# Patient Record
Sex: Male | Born: 1950 | Race: Black or African American | Hispanic: No | Marital: Married | Smoking: Former smoker
Health system: Southern US, Community
[De-identification: ages and names within clinical notes are randomized; demographics above are authoritative.]

## PROBLEM LIST (undated history)

## (undated) DIAGNOSIS — E785 Hyperlipidemia, unspecified: Secondary | ICD-10-CM

## (undated) DIAGNOSIS — I10 Essential (primary) hypertension: Secondary | ICD-10-CM

## (undated) DIAGNOSIS — N184 Chronic kidney disease, stage 4 (severe): Secondary | ICD-10-CM

## (undated) DIAGNOSIS — I2510 Atherosclerotic heart disease of native coronary artery without angina pectoris: Secondary | ICD-10-CM

## (undated) DIAGNOSIS — G4733 Obstructive sleep apnea (adult) (pediatric): Secondary | ICD-10-CM

## (undated) HISTORY — DX: Hyperlipidemia, unspecified: E78.5

## (undated) HISTORY — DX: Chronic kidney disease, stage 4 (severe): N18.4

## (undated) HISTORY — DX: Atherosclerotic heart disease of native coronary artery without angina pectoris: I25.10

## (undated) HISTORY — PX: NASAL SINUS SURGERY: SHX719

## (undated) HISTORY — DX: Morbid (severe) obesity due to excess calories: E66.01

## (undated) HISTORY — DX: Obstructive sleep apnea (adult) (pediatric): G47.33

## (undated) HISTORY — DX: Essential (primary) hypertension: I10

## (undated) HISTORY — PX: CHOLECYSTECTOMY: SHX55

---

## 2003-06-10 ENCOUNTER — Ambulatory Visit (HOSPITAL_COMMUNITY): Admission: RE | Admit: 2003-06-10 | Discharge: 2003-06-10 | Payer: Self-pay | Admitting: Cardiology

## 2003-08-19 ENCOUNTER — Encounter: Admission: RE | Admit: 2003-08-19 | Discharge: 2003-11-17 | Payer: Self-pay | Admitting: Cardiology

## 2004-06-27 ENCOUNTER — Ambulatory Visit: Payer: Self-pay | Admitting: Cardiology

## 2004-12-21 ENCOUNTER — Ambulatory Visit: Payer: Self-pay | Admitting: Cardiology

## 2004-12-28 ENCOUNTER — Ambulatory Visit: Payer: Self-pay | Admitting: Cardiology

## 2004-12-28 ENCOUNTER — Inpatient Hospital Stay (HOSPITAL_BASED_OUTPATIENT_CLINIC_OR_DEPARTMENT_OTHER): Admission: RE | Admit: 2004-12-28 | Discharge: 2004-12-28 | Payer: Self-pay | Admitting: Cardiology

## 2005-02-21 ENCOUNTER — Ambulatory Visit: Payer: Self-pay | Admitting: Cardiology

## 2006-03-07 ENCOUNTER — Ambulatory Visit: Payer: Self-pay | Admitting: Cardiology

## 2010-01-04 ENCOUNTER — Ambulatory Visit: Payer: Self-pay | Admitting: Internal Medicine

## 2010-01-09 ENCOUNTER — Ambulatory Visit (HOSPITAL_COMMUNITY): Admission: RE | Admit: 2010-01-09 | Discharge: 2010-01-10 | Payer: Self-pay | Admitting: General Surgery

## 2010-01-09 ENCOUNTER — Encounter (INDEPENDENT_AMBULATORY_CARE_PROVIDER_SITE_OTHER): Payer: Self-pay | Admitting: General Surgery

## 2010-05-08 LAB — HEPATIC FUNCTION PANEL
ALT: 100 U/L — ABNORMAL HIGH (ref 0–53)
ALT: 40 U/L (ref 0–53)
ALT: 49 U/L (ref 0–53)
AST: 56 U/L — ABNORMAL HIGH (ref 0–37)
Albumin: 4.4 g/dL (ref 3.5–5.2)
Alkaline Phosphatase: 59 U/L (ref 39–117)
Alkaline Phosphatase: 78 U/L (ref 39–117)
Indirect Bilirubin: 0.5 mg/dL (ref 0.3–0.9)
Indirect Bilirubin: 0.6 mg/dL (ref 0.3–0.9)
Indirect Bilirubin: 0.8 mg/dL (ref 0.3–0.9)
Total Bilirubin: 0.8 mg/dL (ref 0.3–1.2)
Total Protein: 7.9 g/dL (ref 6.0–8.3)
Total Protein: 8.5 g/dL — ABNORMAL HIGH (ref 6.0–8.3)

## 2010-05-08 LAB — DIFFERENTIAL
Basophils Absolute: 0.1 10*3/uL (ref 0.0–0.1)
Eosinophils Absolute: 0.2 10*3/uL (ref 0.0–0.7)
Eosinophils Relative: 2 % (ref 0–5)
Eosinophils Relative: 4 % (ref 0–5)
Lymphocytes Relative: 22 % (ref 12–46)
Lymphocytes Relative: 22 % (ref 12–46)
Lymphs Abs: 1.8 10*3/uL (ref 0.7–4.0)
Monocytes Relative: 10 % (ref 3–12)
Neutro Abs: 4.2 10*3/uL (ref 1.7–7.7)

## 2010-05-08 LAB — BASIC METABOLIC PANEL
BUN: 12 mg/dL (ref 6–23)
BUN: 15 mg/dL (ref 6–23)
CO2: 26 mEq/L (ref 19–32)
Chloride: 102 mEq/L (ref 96–112)
Chloride: 105 mEq/L (ref 96–112)
Creatinine, Ser: 1.51 mg/dL — ABNORMAL HIGH (ref 0.4–1.5)
GFR calc Af Amer: 36 mL/min — ABNORMAL LOW (ref >60)
GFR calc non Af Amer: 48 mL/min — ABNORMAL LOW (ref >60)
Potassium: 4.5 mEq/L (ref 3.5–5.1)
Potassium: 4.7 mEq/L (ref 3.5–5.1)
Sodium: 137 mEq/L (ref 135–145)
Sodium: 139 mEq/L (ref 135–145)

## 2010-05-08 LAB — CBC
HCT: 36.8 % — ABNORMAL LOW (ref 39.0–52.0)
Hemoglobin: 12 g/dL — ABNORMAL LOW (ref 13.0–17.0)
MCH: 26.5 pg (ref 26.0–34.0)
MCV: 81.5 fL (ref 78.0–100.0)
Platelets: 300 10*3/uL (ref 150–400)
RBC: 4.52 MIL/uL (ref 4.22–5.81)
RDW: 16.5 % — ABNORMAL HIGH (ref 11.5–15.5)
WBC: 6.3 10*3/uL (ref 4.0–10.5)
WBC: 7.9 10*3/uL (ref 4.0–10.5)

## 2010-05-08 LAB — SURGICAL PCR SCREEN: MRSA, PCR: NEGATIVE

## 2010-05-08 LAB — GLUCOSE, CAPILLARY: Glucose-Capillary: 158 mg/dL — ABNORMAL HIGH (ref 70–99)

## 2010-05-08 LAB — AMYLASE: Amylase: 62 U/L (ref 0–105)

## 2010-07-13 NOTE — Assessment & Plan Note (Signed)
[facility] OFFICE NOTE   NAME:[name], [name] ORZECH                       MRN:          [id number]  DATE:03/07/2006                            DOB:          Apr 07, 1950    REASON FOR VISIT:  Routine cardiac followup.   HISTORY OF PRESENT ILLNESS:  [name] [name] returns for a yearly followup  visit.  He continues to report no problems with significant angina.  He  has had some problems with blood pressure control recently, and is now  on two new medications, labetalol and amlodipine, and good blood  pressure control today.  He is reporting some trouble with leg pain with  ambulation suggesting claudication, and states that this has been  getting slowly worse over the last few years.  His electrocardiogram  today shows sinus rhythm at 69 beats per minute.  He continues to use  CPAP at home for sleep apnea.  He states that Dr. Rowe Pavy has been  following his lipids.  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   ALLERGIES:  No known drug allergies.   PRESENT MEDICATIONS:  1. Aspirin 81 mg p.o. daily.  2. Diovan 160 mg p.o. daily.  3. Amlodipine 10 mg p.o. daily.  4. Lasix 40 p.o. daily.  5. Labetalol 200 mg p.o. b.i.d.  6. Doxazosin 10 mg p.o. daily.  7. Actos 30 mg p.o. daily.  8. Gemfibrozil 600 mg p.o. b.i.d.  9. Metformin 1000 mg p.o. b.i.d.  10.Glimepiride 4 mg p.o. daily.   REVIEW OF SYSTEMS:  As described in history of present illness.  He has  had no palpitations or syncope.  He does not report any major lower  extremity edema.   PHYSICAL EXAMINATION:  Blood pressure is 115/70, heart rate is 69,  weight is 351 pounds.  This is a morbidly obese male in no acute distress.  HEENT:  Conjunctivae and lids are normal.  Oropharynx is clear.  NECK:  Supple without elevated jugular venous pressure or loud bruits.  LUNGS:  Clear without labored breathing.  CARDIAC:  Exam reveals a regular rate and  rhythm without rub, murmur or  gallop.  ABDOMEN:  Obese, unable to adequately palpate organs.  Bowel sounds  present.  No tenderness to palpation.  EXTREMITIES:  Revealed 1+ edema below the knees bilaterally, symmetric.  SKIN:  Warm and dry, no ulcerative changes are noted.  MUSCULOSKELETAL:  No kyphosis is noted.   IMPRESSION AND RECOMMENDATIONS:  1. Nonobstructive coronary artery disease with previously documented      50% proximal left anterior descending stenosis, presently      asymptomatic from the perspective of angina.  I have reinforced      with him risk factor modification including weight loss and optimal      control of his diabetes, hypertension and hyperlipidemia under the      direction of Dr. Rowe Pavy.  His goal LDL cholesterol should be in the      70 to 80 range.  Blood pressure looks good today, and his  electrocardiogram is normal.  I will continue basic symptom      surveillance on a yearly basis.  2. Leg discomfort.  As outlined suggestive of potential claudication.      We will arrange lower extremity arterial studies and proceed from      there.     Satira Sark, MD  Electronically Signed    SGM/MedQ  DD: 03/07/2006  DT: 03/08/2006  Job #: [id number]   cc:   Sherril Cong, MD

## 2010-07-13 NOTE — Cardiovascular Report (Signed)
[name] [name], [name] [name] NO.:  [id number]   MEDICAL RECORD NO.:  [id number]          PATIENT TYPE:  OIB   LOCATION:  1962                         FACILITY:  [facility]   PHYSICIAN:  [name] [name], M.D. [facility] DATE OF BIRTH:  [birth year]   DATE OF PROCEDURE:  12/28/2004  DATE OF DISCHARGE:                              CARDIAC CATHETERIZATION   CLINICAL HISTORY:  [name] [name] is 60 years old and is a resident of  [place], [place].  He was cathed 18 months ago by Dr. Albertine [name] and found  to have nonobstructive disease.  He was enrolled in the [facility] trial  and underwent intravascular ultrasound of the LAD and was started on  STRADIVARIUS study drug.  He now returns for follow-up catheterization.  He  has had no cardiac symptoms.   PROCEDURE:  The procedure was performed via the right femoral arteries and  arterial sheath.  We used 5 [place] catheters for the right coronary artery  and left ventriculogram and we used a 6 [place] q.4h. guiding catheter with  side holes for the LAD.  At completion of the diagnostic angiogram, we  performed intravascular ultrasound.  The patient was given weight adjusted  heparin upon the ACT greater than 200 seconds.  We passed a Luge wire down  to the distal LAD.  After giving intracoronary nitroglycerin, we advanced an  IVUS catheter down to just past the mid LAD and did automatic pullback.  Repeat diagnostic study was then performed through the guiding catheter.  The patient tolerated the procedure well and left the laboratory in  satisfactory condition.   RESULTS:  Left main coronary artery:  Left main coronary artery was free of  significant disease.   Left anterior descending coronary artery:  Left anterior descending coronary  artery gave rise to a large diagonal branch and large septal perforator and  then two small septal perforators under the very small diagonal branch.  There was 50% narrowing in the proximal LAD after the first  septal  perforator and there were irregularities in the proximal and mid LAD.   Circumflex artery:  Circumflex artery gave to a large ramus branch and two  posterolateral branches.  These vessels were irregular but free of major  obstruction.   Right coronary artery:  The right coronary artery was very ectatic in its  proximal and mid portion.  The vessel gave rise to a conus branch, right  ventricular branch, dual posterior descending branches and two  posterolateral branches.  These vessels were irregular and ectatic but there  was no significant obstruction.   Left ventriculogram:  The left ventriculogram was performed in the RAO  projection shows good wall motion with no areas of hypokinesis.  The  estimated ejection fraction was 60%.  The IVUS measurements in the LAD  showed the vessel was very clean in the mid portion.  There was a large  plaque burden in the proximal portion and at its most severe area, there was  an 80% plaque burden meaning that the area of plaque was 80% the area of the  vessel.  The  lumen was about 2.4.  It was a very large vessel with a  diameter of about 5.3 and the lumen of about 2.4 at its highest portion.  The area of stenosis measured as the area of tightest stenosis divided by  the distal reference lumen area was 55%.  Aortic pressure was 158/93 with  mean of 121 and left ventricular pressure was 158/22.   CONCLUSION:  1.  Nonobstructive coronary artery disease with 50% narrowing in the      proximal left anterior descending,  Irregularities in the circumflex artery, marked ectasia of the right  coronary artery and normal left ventricular function with an estimated  ejection fraction of 60%.  1.  Intravascular ultrasound as part of the STRADIVARIUS trial with      moderately large plaque burden in the proximal left anterior descending.   RECOMMENDATIONS:  Will plan continued medical management and secondary to  risk factor modification.            ______________________________  [name] [name], M.D. [facility]     BB/MEDQ  D:  12/28/2004  T:  12/28/2004  Job:  [id number]   cc:   [name] [name], M.D. [facility]  [address]  [place]  [place] [address]   Cardiopulmonary Lab

## 2010-07-13 NOTE — Cardiovascular Report (Signed)
[name] [name], [name] [name]                          ACCOUNT NO.:  [id number]   MEDICAL RECORD NO.:  [id number]                   PATIENT TYPE:  OIB   LOCATION:  2858                                 FACILITY:  [facility]   PHYSICIAN:  Ethelle Lyon, M.D. [facility]         DATE OF BIRTH:  [birth year]   DATE OF PROCEDURE:  06/10/2003  DATE OF DISCHARGE:                              CARDIAC CATHETERIZATION   PROCEDURE:  Left-heart catheterization, left ventriculography, coronary  angiography, intravascular ultrasound of the LAD for the Stradivarius trial.   INDICATION:  Mr. Richburg is a 60 year old gentleman with risk factors of  morbid obesity, sleep apnea, diabetes mellitus, hypertension and  dyslipidemia who presents with exertional dyspnea and chest discomfort.  Exercise Cardiolite performed on May 27, 2003, suggested ischemia of the  inferior and inferolateral walls.  He was therefore referred for diagnostic  angiography, right heart catheterization.  The patient also consented to  participate in the Stradivarius trial.   PROCEDURAL TECHNIQUE:  Informed consent was obtained.  Under 1% lidocaine  local anesthesia, a 7-French sheath was placed in the right common femoral  vein, and a 6-French sheath in the right common femoral artery using the  modified Seldinger technique.  Diagnostic angiography and ventriculography  were performed using JL-4, JR-4 and pigtail catheters.  We then proceeded to  intravascular ultrasound.  Anticoagulation was initiated with bivalirudin to  achieve and maintain an ACT of greater than 200 seconds.  A 6-French CLS-4  guide was advanced over wire and engaged in the ostium of the left main.  A  luge wire was advanced to the distal LAD without difficulty.  Intracoronary  nitroglycerin, 200 micrograms, was administered.  Intravascular ultrasound  was then performed using automated pullback.  Intravenous ultrasound  catheter and wire were then removed.  Repeat angiography  demonstrated no  damage to the vessel.  The patient tolerated the procedure well and was  transferred to the holding room in stable condition.   COMPLICATIONS:  None.   FINDINGS:  1. Hemodynamics:  RA 25/19/17, RV 47/15/22.  PA 50/25/35.  PCW 30.  LV     143/14/30, cardiac output/index 8.1/3.2.  2. No aortic stenosis or mitral stenosis.   VENTRICULOGRAPHY:  Ejection fraction 65% without regional wall-motion  abnormality.  There is no mitral regurgitation.   CORONARY ANGIOGRAPHY:  1. Left main:  Angiographically normal.  2. LAD:  A large vessel which is ectatic proximally.  It gives rise to a     single large diagonal branch.  The mid LAD has 30 then 20% stenosis.  3. Ramus intermedius:  A large vessel which is angiographically normal.  4. Circumflex:  A small vessel giving rise to a single obtuse marginal.     There is a 20% stenosis of the proximal vessel.  It is ectatic     proximally.  5. RCA:  A very-large, ectatic vessel.  No stenosis.   IMPRESSION/RECOMMENDATION:  The patient with ectatic coronaries and mild  coronary artery disease.  He also has moderate pulmonary hypertension in the  setting of elevated left ventricular end-diastolic pressure.  We will  continue with aggressive risk-factor modification.  In addition, the patient  will be participating in the Stradivarius trial randomizing rimonabant  versus placebo.                                               Ethelle Lyon, M.D. [facility]    WED/MEDQ  D:  06/10/2003  T:  06/12/2003  Job:  [id number]   cc:   Sherril Cong, Dr.   Satira Sark, M.D. [facility]

## 2010-08-17 ENCOUNTER — Ambulatory Visit (INDEPENDENT_AMBULATORY_CARE_PROVIDER_SITE_OTHER): Payer: 59 | Admitting: Adult Health

## 2010-08-17 ENCOUNTER — Encounter: Payer: Self-pay | Admitting: Adult Health

## 2010-08-17 DIAGNOSIS — I2510 Atherosclerotic heart disease of native coronary artery without angina pectoris: Secondary | ICD-10-CM

## 2010-08-17 DIAGNOSIS — R0602 Shortness of breath: Secondary | ICD-10-CM

## 2010-08-17 DIAGNOSIS — G4733 Obstructive sleep apnea (adult) (pediatric): Secondary | ICD-10-CM | POA: Insufficient documentation

## 2010-08-17 DIAGNOSIS — I10 Essential (primary) hypertension: Secondary | ICD-10-CM

## 2010-08-17 DIAGNOSIS — E782 Mixed hyperlipidemia: Secondary | ICD-10-CM | POA: Insufficient documentation

## 2010-08-17 NOTE — Assessment & Plan Note (Signed)
The patient has been seen and examined by myself and Dr. Lattie Haw.  The plan is to have a 2 day stress myoview completed to evaluate for progression of CAD in patient with multiple CVRF's,  Will also evaluate him with Echo for LV fx with known HTN and OSA for PA pressures.  Will follow-up with him to discuss the results.

## 2010-08-17 NOTE — Assessment & Plan Note (Signed)
ABG's were not completed with the PFT's and therefore will have this done for evaluation.

## 2010-08-17 NOTE — Progress Notes (Signed)
HPI:  Mr. Broner is a morbidly obese patient who has been lost to follow-up from a cardiac standpoint, last seen by Dr. Domenic Polite in January of 2008, with known history of nonobstructive CAD per cath in 2005 and 2006 with 50% LAD stenosis, OSA, diabetes, Hypertension, hyperlipidemia.  He is followed by Dr. Iona Beard and was seen recently for complaints of worsening DOE. He had PFT's completed that were found to be normal.  He has had recent labs to evaluate kidney status, cholesterol status which were also found to be WNL.  As a result of known history of CAD, we are asked to reevaluate him to assess if increased shortness of breath may be related to progressive CAD.  He denies chest pain, but states that he cannot work more than 176ft without having to stop and rest because of shortness of breath. He states tha the is not dizzy or diaphoretic, but has noticed increased fatigue. He is medically compliant.  No Known Allergies  Current Outpatient Prescriptions  Medication Sig Dispense Refill  . amLODipine (NORVASC) 10 MG tablet Take 10 mg by mouth daily.        Marland Kitchen aspirin 81 MG tablet Take 81 mg by mouth daily.        Marland Kitchen doxazosin (CARDURA) 2 MG tablet Take 2 mg by mouth 3 (three) times daily.        . furosemide (LASIX) 40 MG tablet Take 40 mg by mouth daily.        Marland Kitchen gemfibrozil (LOPID) 600 MG tablet Take 600 mg by mouth 2 (two) times daily.        Marland Kitchen glimepiride (AMARYL) 4 MG tablet Take 4 mg by mouth daily before breakfast.        . labetalol (NORMODYNE) 200 MG tablet Take 200 mg by mouth daily.        . metFORMIN (GLUCOPHAGE) 1000 MG tablet Take 1,000 mg by mouth 2 (two) times daily with a meal.        . Multiple Vitamin (ONE-A-DAY MENS PO) Take 1 tablet by mouth daily.        . saxagliptin HCl (ONGLYZA) 5 MG TABS tablet Take 5 mg by mouth daily.        . valsartan (DIOVAN) 160 MG tablet Take 160 mg by mouth daily.          Past Medical History  Diagnosis Date  . Coronary artery disease    Nonobstructive per cath in 2005 and 2006 with 50% LAD stenosis, rechecked with IVUS  during cath in 2006.  Marland Kitchen OSA (obstructive sleep apnea)     Uses CPAP  . Hypertension   . Hyperlipidemia   . Obesity, morbid   . Diabetes mellitus     Past Surgical History  Procedure Date  . Cholecystectomy     [id number] of systems complete and found to be negative unless listed above PHYSICAL EXAM BP 148/73  Pulse 69  Ht 5\' 6"  (1.676 m)  Wt 341 lb (154.677 kg)  BMI 55.04 kg/m2  SpO2 97% General: Well developed, well nourished, morbidly obese in no acute distress Head: Eyes PERRLA, No xanthomas.   Normal cephalic and atramatic  Lungs: Clear bilaterally to auscultation and percussion. Heart: HRRR S1 S2,  Distant HS.  Pulses are 2+ & equal.            No carotid bruit. No JVD.  No abdominal bruits. No femoral bruits. Abdomen: Bowel sounds are positive, abdomen soft and non-tender without masses or  Hernia's noted. Msk:  Back normal, normal gait. Normal strength and tone for age. Extremities: No clubbing, cyanosis or edema.  DP +1 Neuro: Alert and oriented X 3. Psych:  Good affect, responds appropriately  EKG: SR with 1st degree block. Rate of 60 bpm. Left axis deviation with nonspecific ST abnormality.  ASSESSMENT AND PLAN

## 2010-08-17 NOTE — Assessment & Plan Note (Signed)
Gastric bypass surgery is discussed with the patient by Dr. Lattie Haw, but the patients insurance does not cover this surgery.  He is advised on a wt loss diet and to increase his activity.  He is a Administrator and very sedentary.

## 2010-08-17 NOTE — Patient Instructions (Addendum)
Your physician has requested that you have en exercise stress myoview. For further information please visit [email or web address]. Please follow instruction sheet, as given.  Your physician has requested that you have an echocardiogram. Echocardiography is a painless test that uses sound waves to create images of your heart. It provides your doctor with information about the size and shape of your heart and how well your heart's chambers and valves are working. This procedure takes approximately one hour. There are no restrictions for this procedure.  Your physician recommends you have Arterial Blood Gas drawn  Your physician recommends that you schedule a follow-up appointment in: after tests.

## 2010-08-23 ENCOUNTER — Encounter: Payer: Self-pay | Admitting: *Deleted

## 2010-08-24 ENCOUNTER — Encounter: Payer: Self-pay | Admitting: Adult Health

## 2010-09-03 ENCOUNTER — Other Ambulatory Visit: Payer: Self-pay | Admitting: Cardiology

## 2010-09-03 DIAGNOSIS — I10 Essential (primary) hypertension: Secondary | ICD-10-CM

## 2010-09-06 ENCOUNTER — Encounter (HOSPITAL_COMMUNITY)
Admission: RE | Admit: 2010-09-06 | Discharge: 2010-09-06 | Disposition: A | Discharge status: Home / Self Care | Payer: 59 | Source: Ambulatory Visit | Attending: Cardiology | Admitting: Cardiology

## 2010-09-06 ENCOUNTER — Encounter (HOSPITAL_COMMUNITY): Payer: Self-pay | Admitting: Cardiology

## 2010-09-06 ENCOUNTER — Ambulatory Visit (HOSPITAL_COMMUNITY)
Admission: RE | Admit: 2010-09-06 | Discharge: 2010-09-06 | Disposition: A | Discharge status: Home / Self Care | Payer: 59 | Source: Ambulatory Visit | Attending: Cardiology | Admitting: Cardiology

## 2010-09-06 ENCOUNTER — Encounter (HOSPITAL_COMMUNITY): Payer: Self-pay

## 2010-09-06 ENCOUNTER — Ambulatory Visit (HOSPITAL_COMMUNITY): Payer: 59

## 2010-09-06 ENCOUNTER — Ambulatory Visit (INDEPENDENT_AMBULATORY_CARE_PROVIDER_SITE_OTHER): Payer: 59 | Admitting: *Deleted

## 2010-09-06 DIAGNOSIS — R0602 Shortness of breath: Secondary | ICD-10-CM

## 2010-09-06 DIAGNOSIS — I2510 Atherosclerotic heart disease of native coronary artery without angina pectoris: Secondary | ICD-10-CM | POA: Insufficient documentation

## 2010-09-06 DIAGNOSIS — I517 Cardiomegaly: Secondary | ICD-10-CM

## 2010-09-06 DIAGNOSIS — I10 Essential (primary) hypertension: Secondary | ICD-10-CM

## 2010-09-06 DIAGNOSIS — G4733 Obstructive sleep apnea (adult) (pediatric): Secondary | ICD-10-CM

## 2010-09-06 DIAGNOSIS — R0989 Other specified symptoms and signs involving the circulatory and respiratory systems: Secondary | ICD-10-CM | POA: Insufficient documentation

## 2010-09-06 DIAGNOSIS — R0609 Other forms of dyspnea: Secondary | ICD-10-CM | POA: Insufficient documentation

## 2010-09-06 DIAGNOSIS — R079 Chest pain, unspecified: Secondary | ICD-10-CM | POA: Insufficient documentation

## 2010-09-06 DIAGNOSIS — E119 Type 2 diabetes mellitus without complications: Secondary | ICD-10-CM | POA: Insufficient documentation

## 2010-09-06 MED ORDER — TECHNETIUM TC 99M TETROFOSMIN IV KIT: 30.0000 | PACK | Freq: Once | INTRAVENOUS | Status: DC | PRN

## 2010-09-06 NOTE — Progress Notes (Deleted)

## 2010-09-07 ENCOUNTER — Encounter (HOSPITAL_COMMUNITY)
Admission: RE | Admit: 2010-09-07 | Discharge: 2010-09-07 | Disposition: A | Discharge status: Home / Self Care | Payer: 59 | Source: Ambulatory Visit | Attending: Cardiology | Admitting: Cardiology

## 2010-09-07 MED ORDER — TECHNETIUM TC 99M TETROFOSMIN IV KIT
20.0000 | PACK | Freq: Once | INTRAVENOUS | Status: AC | PRN
  Administered 2010-09-07: 26 via INTRAVENOUS

## 2010-09-14 ENCOUNTER — Encounter: Payer: Self-pay | Admitting: *Deleted

## 2010-09-21 NOTE — Progress Notes (Signed)
Encounter note per Epic requirement.

## 2012-10-21 ENCOUNTER — Encounter (INDEPENDENT_AMBULATORY_CARE_PROVIDER_SITE_OTHER): Payer: 59 | Admitting: Ophthalmology

## 2012-10-21 DIAGNOSIS — I10 Essential (primary) hypertension: Secondary | ICD-10-CM

## 2012-10-21 DIAGNOSIS — E11319 Type 2 diabetes mellitus with unspecified diabetic retinopathy without macular edema: Secondary | ICD-10-CM

## 2012-10-21 DIAGNOSIS — H43819 Vitreous degeneration, unspecified eye: Secondary | ICD-10-CM

## 2012-10-21 DIAGNOSIS — E1139 Type 2 diabetes mellitus with other diabetic ophthalmic complication: Secondary | ICD-10-CM

## 2012-10-21 DIAGNOSIS — H2510 Age-related nuclear cataract, unspecified eye: Secondary | ICD-10-CM

## 2012-10-21 DIAGNOSIS — H35039 Hypertensive retinopathy, unspecified eye: Secondary | ICD-10-CM

## 2013-05-03 ENCOUNTER — Ambulatory Visit (INDEPENDENT_AMBULATORY_CARE_PROVIDER_SITE_OTHER): Payer: 59 | Admitting: Ophthalmology

## 2016-06-03 DIAGNOSIS — D472 Monoclonal gammopathy: Secondary | ICD-10-CM | POA: Insufficient documentation

## 2016-07-02 DIAGNOSIS — D487 Neoplasm of uncertain behavior of other specified sites: Secondary | ICD-10-CM | POA: Insufficient documentation

## 2016-10-06 DIAGNOSIS — N184 Chronic kidney disease, stage 4 (severe): Secondary | ICD-10-CM | POA: Insufficient documentation

## 2016-10-06 DIAGNOSIS — N529 Male erectile dysfunction, unspecified: Secondary | ICD-10-CM | POA: Insufficient documentation

## 2016-10-06 DIAGNOSIS — E291 Testicular hypofunction: Secondary | ICD-10-CM | POA: Insufficient documentation

## 2016-10-06 DIAGNOSIS — R972 Elevated prostate specific antigen [PSA]: Secondary | ICD-10-CM | POA: Insufficient documentation

## 2017-03-18 DIAGNOSIS — H2510 Age-related nuclear cataract, unspecified eye: Secondary | ICD-10-CM | POA: Insufficient documentation

## 2017-03-18 DIAGNOSIS — IMO0001 Reserved for inherently not codable concepts without codable children: Secondary | ICD-10-CM | POA: Insufficient documentation

## 2017-03-18 DIAGNOSIS — E113299 Type 2 diabetes mellitus with mild nonproliferative diabetic retinopathy without macular edema, unspecified eye: Secondary | ICD-10-CM | POA: Insufficient documentation

## 2018-07-09 ENCOUNTER — Other Ambulatory Visit: Payer: Self-pay | Admitting: Physician Assistant

## 2018-07-09 DIAGNOSIS — M5416 Radiculopathy, lumbar region: Secondary | ICD-10-CM

## 2018-07-13 ENCOUNTER — Other Ambulatory Visit: Payer: Self-pay

## 2018-07-13 ENCOUNTER — Ambulatory Visit
Admission: RE | Admit: 2018-07-13 | Discharge: 2018-07-13 | Disposition: A | Discharge status: Home / Self Care | Payer: 59 | Source: Ambulatory Visit | Attending: Physician Assistant | Admitting: Physician Assistant

## 2018-07-13 DIAGNOSIS — M5416 Radiculopathy, lumbar region: Secondary | ICD-10-CM

## 2018-11-30 ENCOUNTER — Other Ambulatory Visit: Payer: Self-pay | Admitting: Physical Medicine and Rehabilitation

## 2018-11-30 ENCOUNTER — Other Ambulatory Visit: Payer: Self-pay

## 2018-11-30 ENCOUNTER — Ambulatory Visit
Admission: RE | Admit: 2018-11-30 | Discharge: 2018-11-30 | Disposition: A | Discharge status: Home / Self Care | Payer: 59 | Source: Ambulatory Visit | Attending: Physical Medicine and Rehabilitation | Admitting: Physical Medicine and Rehabilitation

## 2018-11-30 DIAGNOSIS — M25562 Pain in left knee: Secondary | ICD-10-CM

## 2018-11-30 DIAGNOSIS — M25561 Pain in right knee: Secondary | ICD-10-CM

## 2019-11-22 ENCOUNTER — Ambulatory Visit: Payer: 59 | Admitting: Physical Medicine and Rehabilitation

## 2019-11-22 ENCOUNTER — Encounter: Payer: Self-pay | Admitting: Physical Medicine and Rehabilitation

## 2019-11-22 ENCOUNTER — Other Ambulatory Visit: Payer: Self-pay

## 2019-11-22 VITALS — BP 158/87 | HR 97

## 2019-11-22 DIAGNOSIS — M47816 Spondylosis without myelopathy or radiculopathy, lumbar region: Secondary | ICD-10-CM

## 2019-11-22 DIAGNOSIS — M5442 Lumbago with sciatica, left side: Secondary | ICD-10-CM | POA: Diagnosis not present

## 2019-11-22 DIAGNOSIS — M1990 Unspecified osteoarthritis, unspecified site: Secondary | ICD-10-CM | POA: Insufficient documentation

## 2019-11-22 DIAGNOSIS — M25551 Pain in right hip: Secondary | ICD-10-CM

## 2019-11-22 DIAGNOSIS — K449 Diaphragmatic hernia without obstruction or gangrene: Secondary | ICD-10-CM | POA: Insufficient documentation

## 2019-11-22 DIAGNOSIS — M25552 Pain in left hip: Secondary | ICD-10-CM

## 2019-11-22 DIAGNOSIS — G8929 Other chronic pain: Secondary | ICD-10-CM

## 2019-11-22 DIAGNOSIS — M48062 Spinal stenosis, lumbar region with neurogenic claudication: Secondary | ICD-10-CM | POA: Diagnosis not present

## 2019-11-22 DIAGNOSIS — M5441 Lumbago with sciatica, right side: Secondary | ICD-10-CM

## 2019-11-22 NOTE — Progress Notes (Signed)
Pt state he feels pain in his lower back and both posterior legs. Pt state waking makes the pain worse.Pt states going down stairs hurt the worse. Pt states take pain meds to help ease the pain.    Numeric Pain Rating Scale and Functional Assessment Average Pain 8 Pain Right Now 8 My pain is aching Pain is worse with: walking and standing Pain improves with: rest, heat/ice, therapy/exercise and medication   In the last MONTH (on 0-10 scale) has pain interfered with the following?  1. General activity like being  able to carry out your everyday physical activities such as walking, climbing stairs, carrying groceries, or moving a chair?  Rating(6)  2. Relation with others like being able to carry out your usual social activities and roles such as  activities at home, at work and in your community. Rating(6)  3. Enjoyment of life such that you have  been bothered by emotional problems such as feeling anxious, depressed or irritable?  Rating(6)

## 2019-11-22 NOTE — Progress Notes (Signed)
[name] [name] - 69 y.o. male MRN [id number]  Date of birth: Nov 03, 1950  Office Visit Note: Visit Date: 11/22/2019 PCP: Iona Beard, MD Referred by: Iona Beard, MD  Subjective: Chief Complaint  Patient presents with  . Lower Back - Pain   HPI: [name] [name] is a 69 y.o. male who comes in today At the request of Dr. Neomia Dear for evaluation management of chronic worsening axial low back pain with some referral into the bilateral posterior lateral hips and thighs to about the knee.  He comes in today to establish care and she no longer takes the patient's insurance.  He has been seeing her for several years and reports initial injection treatment and interventional spine treatment was very beneficial.  He reports more recently it has not been as beneficial and he saw her about a month or so ago for injection that just did not help very much at all.  Prior injection to that was beneficial for 3 months.  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  Sitting is painful but getting up and moving and walking and standing are the worst at this point.  Trying to load the trucking manipulate the trailer is quite painful.  His case is complicated by type 2 diabetes with some complications as well as morbid obesity.  He is still pretty physically active.  He has had chiropractic care in the past as well as physical therapy.  He does endorse current home spine exercises and  he does do a lot of lifting and is very active at work as a Administrator.  The exercises that he does are more stretching and pain relief type exercises.  He has been working with [facility] for quite a while.  He has noted some depression over the last year after losing his wife to liver cancer.  They have been married almost 71 years.  He denies any paresthesias or numbness or tingling and nothing  really past the knee.  Pain is worse with extension and standing.  Unfortunately we could not find the notes from Dr. Niel Hummer although I know she sent them.  We will get those recent to [place].  It sounds like he had a combination of transforaminal injection and may be medial branch block.  Patient does endorse history of nerve block.  He has not had any radiofrequency ablation.  He currently does take Tylenol as needed.  In the past he has had hydrocodone for other problems but not for his back.  The Tylenol is just not enough to help.  Has not really been on muscle relaxers or other medications.  Again back pain and referral pain in the hips and legs for many many years.  Patient has a recent MRI of the lumbar spine and this is reviewed below and reviewed with the patient today with spine models and imaging.  He basically has stenosis at L2-3 which was moderate.  He has multilevel facet arthropathy with signal intensity changes in the joint spaces.  He has degenerative disc height loss at L4-5 and L5-S1.  L5-S1 may be almost autofused.  Review of Systems  Musculoskeletal: Positive for back pain and joint pain.  All other systems reviewed and are negative.  Otherwise per HPI.  Assessment & Plan: Visit Diagnoses:  1. Spinal stenosis of lumbar  region with neurogenic claudication   2. Spondylosis without myelopathy or radiculopathy, lumbar region   3. Chronic bilateral low back pain with bilateral sciatica   4. Pain in left hip   5. Pain in right hip     Plan: Findings:  Chronic severe intermittent exacerbations and current exacerbation of recalcitrant chronic back pain with referral in the hips and legs to about the knee.  He has a combination of lumbar stenosis, lumbar facet arthropathy and degenerative disc changes with almost autofusion at L5-S1.  History of chronic pain despite medication and treatment complicated by diabetes and morbid obesity.  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  He is doing well with intermittent injections in the past but lately those have not been as beneficial.  At this point I want to schedule him for epidural injection at L3-4 from an interlaminar approach given the stenosis at L2-3.  Would consider diagnostic medial branch blocks and radiofrequency ablation potentially and this was gone over with him briefly.  I do want to obtain the notes from Dr. Niel Hummer just to make sure we do not reinvent the wheel.  I will also ask him to take his Tylenol as a scheduled medication i.e. 1 capsule 3 times a day with breakfast lunch and dinner and then the patient can have 1 or 2 as needed if needed.    Meds & Orders: No orders of the defined types were placed in this encounter.  No orders of the defined types were placed in this encounter.   Follow-up: Return for L3-4 interlaminar epidural steroid injection.   Procedures: No procedures performed  No notes on file   Clinical History: MRI LUMBAR SPINE WITHOUT CONTRAST  TECHNIQUE: Multiplanar, multisequence MR imaging of the lumbar spine was performed. No intravenous contrast was administered.  COMPARISON:  None.  FINDINGS: Segmentation:  Normal  Alignment:  Mild retrolisthesis L5-S1.  Remaining alignment normal.  Vertebrae: Negative for fracture. Diffusely decreased signal in the bone marrow on T1 without focal lesion.  Conus medullaris and cauda equina: Conus extends to the L1-2 level. Conus and cauda equina appear normal.  Paraspinal and other soft tissues: Negative for paraspinous mass or adenopathy.  Disc levels:  L1-2: Negative  L2-3: Diffuse bulging of the disc with moderate facet hypertrophy. Moderate spinal stenosis. Moderate subarticular and foraminal stenosis bilaterally.  L3-4: Moderate disc degeneration with disc bulging. Moderate facet hypertrophy bilaterally. Mild spinal stenosis with  moderate subarticular stenosis right greater than left.  L4-5: Moderate to advanced disc degeneration with diffuse endplate spurring. Bilateral facet hypertrophy. Mild subarticular stenosis bilaterally  L5-S1: Advanced disc degeneration with disc space narrowing and endplate spurring. Moderate subarticular stenosis bilaterally. No focal disc protrusion.  IMPRESSION: Multilevel degenerative change throughout the lumbar spine as above. Multilevel spinal and subarticular stenosis bilaterally as described above.  Bone marrow is diffusely abnormal with replacement of fatty marrow . This may be due to diffuse myeloma however no focal lesion is seen. This may also be related to anemia or treatment for multiple myeloma.   Electronically Signed   By: Franchot Gallo M.D.   On: 07/13/2018 09:52   He reports that he has never smoked. He has never used smokeless tobacco. No results for input(s): HGBA1C, LABURIC in the last 8760 hours.  Objective:  VS:  HT:    WT:   BMI:     BP:(!) 158/87  HR:97bpm  TEMP: ( )  RESP:  Physical Exam Constitutional:      General: He is not in acute distress.    Appearance: Normal appearance. He is obese. He is not ill-appearing.  HENT:     Head: Normocephalic and atraumatic.     Right Ear: External ear normal.     Left Ear: External ear normal.  Eyes:     Extraocular Movements: Extraocular movements intact.  Cardiovascular:     Rate and Rhythm: Normal rate.     Pulses: Normal pulses.  Abdominal:     General: There is no distension.     Palpations: Abdomen is soft.  Musculoskeletal:        General: No tenderness or signs of injury.     Right lower leg: No edema.     Left lower leg: No edema.     Comments: Patient has good distal strength without clonus.  He has no pain with hip rotation internal or external.  Some lack of range of motion with internal rotation.  He has concordant pain with facet loading of the lumbar spine with extension  and going from sit to stand.  He ambulates with a forward flexed lumbar spine.  Skin:    Findings: No erythema or rash.  Neurological:     General: No focal deficit present.     Mental Status: He is alert and oriented to person, place, and time.     Sensory: No sensory deficit.     Motor: No weakness or abnormal muscle tone.     Coordination: Coordination normal.  Psychiatric:        Mood and Affect: Mood normal.        Behavior: Behavior normal.     Ortho Exam  Imaging: No results found.  Past Medical/Family/Surgical/Social History: Medications & Allergies reviewed per EMR, new medications updated. Patient Active Problem List   Diagnosis Date Noted  . Diaphragmatic hernia 11/22/2019  . Osteoarthritis 11/22/2019  . Type II or unspecified type diabetes mellitus without mention of complication, uncontrolled 03/18/2017  . Mild nonproliferative diabetic retinopathy ([place]) 03/18/2017  . Nuclear sclerosis 03/18/2017  . ED (erectile dysfunction) of organic origin 10/06/2016  . Elevated prostate specific antigen (PSA) 10/06/2016  . Male hypogonadism 10/06/2016  . Stage 4 chronic kidney disease ([place]) 10/06/2016  . 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 IgA monoclonal gammopathy of uncertain significance 06/03/2016  . CAD (coronary artery disease) 08/17/2010  . Mixed hyperlipidemia 08/17/2010  . Hypertension 08/17/2010  . OSA (obstructive sleep apnea) 08/17/2010  . Morbid obesity ([place]) 08/17/2010  . Esophageal reflux 06/19/2001  . 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 Hypertrophy of prostate without urinary obstruction and other lower urinary tract symptoms (LUTS) 04/30/1999  . Proteinuria 04/30/1999   Past Medical History:  Diagnosis Date  . Coronary artery disease    Nonobstructive per cath in 2005 and 2006 with 50% LAD stenosis, rechecked with IVUS  during cath in 2006.  . Diabetes mellitus   . Hyperlipidemia   . Hypertension   . Obesity, morbid ([place])    . OSA (obstructive sleep apnea)    Uses CPAP   Family History  Problem Relation Age of Onset  . Heart failure Mother   . Hypertension Mother   . Diabetes Mother   . Stroke Father   . Hypertension Father   . Hypertension Sister   . Hypertension Brother    Past Surgical History:  Procedure Laterality Date  . CHOLECYSTECTOMY     Social History  Occupational History  . Not on file  Tobacco Use  . Smoking status: Never Smoker  . Smokeless tobacco: Never Used  Substance and Sexual Activity  . Alcohol use: No  . Drug use: No  . Sexual activity: Yes

## 2019-12-06 ENCOUNTER — Ambulatory Visit (INDEPENDENT_AMBULATORY_CARE_PROVIDER_SITE_OTHER): Payer: 59 | Admitting: Physical Medicine and Rehabilitation

## 2019-12-06 ENCOUNTER — Other Ambulatory Visit: Payer: Self-pay

## 2019-12-06 ENCOUNTER — Ambulatory Visit: Payer: Self-pay

## 2019-12-06 ENCOUNTER — Encounter: Payer: Self-pay | Admitting: Physical Medicine and Rehabilitation

## 2019-12-06 VITALS — BP 140/82 | HR 85

## 2019-12-06 DIAGNOSIS — M48062 Spinal stenosis, lumbar region with neurogenic claudication: Secondary | ICD-10-CM

## 2019-12-06 MED ORDER — METHYLPREDNISOLONE ACETATE 80 MG/ML IJ SUSP
80.0000 mg | Freq: Once | INTRAMUSCULAR | Status: AC
  Administered 2019-12-06: 80 mg

## 2019-12-06 NOTE — Progress Notes (Signed)
[name] [name] - 69 y.o. male MRN [id number]  Date of birth: 1950-09-23  Office Visit Note: Visit Date: 12/06/2019 PCP: Iona Beard, MD Referred by: Iona Beard, MD  Subjective: Chief Complaint  Patient presents with  . Lower Back - Pain   HPI:  [name] [name] is a 69 y.o. male who comes in today at the request of Dr. Laurence Spates for planned Right L3-L4 Lumbar epidural steroid injection with fluoroscopic guidance.  The patient has failed conservative care including home exercise, medications, time and activity modification.  This injection will be diagnostic and hopefully therapeutic.  Please see requesting physician notes for further details and justification.   ROS Otherwise per HPI.  Assessment & Plan: Visit Diagnoses:  1. Spinal stenosis of lumbar region with neurogenic claudication     Plan: No additional findings.   Meds & Orders:  Meds ordered this encounter  Medications  . methylPREDNISolone acetate (DEPO-MEDROL) injection 80 mg    Orders Placed This Encounter  Procedures  . XR C-ARM NO REPORT  . Epidural Steroid injection    Follow-up: Return if symptoms worsen or fail to improve.   Procedures: No procedures performed  Lumbar Epidural Steroid Injection - Interlaminar Approach with Fluoroscopic Guidance  Patient: [name] [name]      Date of Birth: 01-23-1951 MRN: [id number] PCP: Iona Beard, MD      Visit Date: 12/06/2019   Universal Protocol:     Consent Given By: the patient  Position: PRONE  Additional Comments: Vital signs were monitored before and after the procedure. Patient was prepped and draped in the usual sterile fashion. The correct patient, procedure, and site was verified.   Injection Procedure Details:   Procedure diagnoses:  1. Spinal stenosis of lumbar region with neurogenic claudication      Meds Administered:  Meds ordered this encounter  Medications  . methylPREDNISolone acetate (DEPO-MEDROL) injection 80 mg      Laterality: Right  Location/Site:  L5-S1  Needle size: 20 G  Needle type: Tuohy  Needle Placement: Paramedian epidural  Findings:   -Comments: Excellent flow of contrast into the epidural space.  Procedure Details: Using a paramedian approach from the side mentioned above, the region overlying the inferior lamina was localized under fluoroscopic visualization and the soft tissues overlying this structure were infiltrated with 4 ml. of 1% Lidocaine without Epinephrine. The Tuohy needle was inserted into the epidural space using a paramedian approach.   The epidural space was localized using loss of resistance along with counter oblique bi-planar fluoroscopic views.  After negative aspirate for air, blood, and CSF, a 2 ml. volume of Isovue-250 was injected into the epidural space and the flow of contrast was observed. Radiographs were obtained for documentation purposes.    The injectate was administered into the level noted above.   Additional Comments:  The patient tolerated the procedure well Dressing: 2 x 2 sterile gauze and Band-Aid    Post-procedure details: Patient was observed during the procedure. Post-procedure instructions were reviewed.  Patient left the clinic in stable condition.     Clinical History: MRI LUMBAR SPINE WITHOUT CONTRAST  TECHNIQUE: Multiplanar, multisequence MR imaging of the lumbar spine was performed. No intravenous contrast was administered.  COMPARISON:  None.  FINDINGS: Segmentation:  Normal  Alignment:  Mild retrolisthesis L5-S1.  Remaining alignment normal.  Vertebrae: Negative for fracture. Diffusely decreased signal in the bone marrow on T1 without focal lesion.  Conus medullaris and cauda equina: Conus extends to the L1-2  level. Conus and cauda equina appear normal.  Paraspinal and other soft tissues: Negative for paraspinous mass or adenopathy.  Disc levels:  L1-2: Negative  L2-3: Diffuse bulging of the  disc with moderate facet hypertrophy. Moderate spinal stenosis. Moderate subarticular and foraminal stenosis bilaterally.  L3-4: Moderate disc degeneration with disc bulging. Moderate facet hypertrophy bilaterally. Mild spinal stenosis with moderate subarticular stenosis right greater than left.  L4-5: Moderate to advanced disc degeneration with diffuse endplate spurring. Bilateral facet hypertrophy. Mild subarticular stenosis bilaterally  L5-S1: Advanced disc degeneration with disc space narrowing and endplate spurring. Moderate subarticular stenosis bilaterally. No focal disc protrusion.  IMPRESSION: Multilevel degenerative change throughout the lumbar spine as above. Multilevel spinal and subarticular stenosis bilaterally as described above.  Bone marrow is diffusely abnormal with replacement of fatty marrow . This may be due to diffuse myeloma however no focal lesion is seen. This may also be related to anemia or treatment for multiple myeloma.   Electronically Signed   By: Franchot Gallo M.D.   On: 07/13/2018 09:52     Objective:  VS:  HT:    WT:   BMI:     BP:140/82  HR:85bpm  TEMP: ( )  RESP:  Physical Exam Constitutional:      General: He is not in acute distress.    Appearance: Normal appearance. He is obese. He is not ill-appearing.  HENT:     Head: Normocephalic and atraumatic.     Right Ear: External ear normal.     Left Ear: External ear normal.  Eyes:     Extraocular Movements: Extraocular movements intact.  Cardiovascular:     Rate and Rhythm: Normal rate.     Pulses: Normal pulses.  Abdominal:     General: There is no distension.     Palpations: Abdomen is soft.  Musculoskeletal:        General: No tenderness or signs of injury.     Right lower leg: No edema.     Left lower leg: No edema.     Comments: Patient has good distal strength without clonus.  Skin:    Findings: No erythema or rash.  Neurological:     General: No focal  deficit present.     Mental Status: He is alert and oriented to person, place, and time.     Sensory: No sensory deficit.     Motor: No weakness or abnormal muscle tone.     Coordination: Coordination normal.  Psychiatric:        Mood and Affect: Mood normal.        Behavior: Behavior normal.      Imaging: XR C-ARM NO REPORT  Result Date: 12/06/2019 Please see Notes tab for imaging impression.

## 2019-12-06 NOTE — Procedures (Signed)
Lumbar Epidural Steroid Injection - Interlaminar Approach with Fluoroscopic Guidance  Patient: [name] [name]      Date of Birth: [birth year] MRN: [id number] PCP: Iona Beard, MD      Visit Date: 12/06/2019   Universal Protocol:     Consent Given By: the patient  Position: PRONE  Additional Comments: Vital signs were monitored before and after the procedure. Patient was prepped and draped in the usual sterile fashion. The correct patient, procedure, and site was verified.   Injection Procedure Details:   Procedure diagnoses:  1. Spinal stenosis of lumbar region with neurogenic claudication      Meds Administered:  Meds ordered this encounter  Medications  . methylPREDNISolone acetate (DEPO-MEDROL) injection 80 mg     Laterality: Right  Location/Site:  L5-S1  Needle size: 20 G  Needle type: Tuohy  Needle Placement: Paramedian epidural  Findings:   -Comments: Excellent flow of contrast into the epidural space.  Procedure Details: Using a paramedian approach from the side mentioned above, the region overlying the inferior lamina was localized under fluoroscopic visualization and the soft tissues overlying this structure were infiltrated with 4 ml. of 1% Lidocaine without Epinephrine. The Tuohy needle was inserted into the epidural space using a paramedian approach.   The epidural space was localized using loss of resistance along with counter oblique bi-planar fluoroscopic views.  After negative aspirate for air, blood, and CSF, a 2 ml. volume of Isovue-250 was injected into the epidural space and the flow of contrast was observed. Radiographs were obtained for documentation purposes.    The injectate was administered into the level noted above.   Additional Comments:  The patient tolerated the procedure well Dressing: 2 x 2 sterile gauze and Band-Aid    Post-procedure details: Patient was observed during the procedure. Post-procedure instructions were  reviewed.  Patient left the clinic in stable condition.

## 2019-12-06 NOTE — Progress Notes (Signed)
Pt state lower back pain. Pt state walking and standing for a long period of time makes the pain worse. Pt state pain meds helps ease the pain.  Numeric Pain Rating Scale and Functional Assessment Average Pain 8   In the last MONTH (on 0-10 scale) has pain interfered with the following?  1. General activity like being  able to carry out your everyday physical activities such as walking, climbing stairs, carrying groceries, or moving a chair?  Rating(10)   +Driver, -BT, +Dye Allergies.

## 2020-06-26 ENCOUNTER — Telehealth: Payer: Self-pay

## 2020-06-26 NOTE — Telephone Encounter (Signed)
Pt requesting repeat of right L5-S1 IL, lasted inj was on 12/06/19.

## 2020-06-26 NOTE — Telephone Encounter (Signed)
Called pt and sch 5/16

## 2020-06-26 NOTE — Telephone Encounter (Signed)
Pt has requested Rx for his appt on 5/16

## 2020-06-26 NOTE — Telephone Encounter (Signed)
Okay to sch 

## 2020-06-26 NOTE — Telephone Encounter (Signed)
Patient called he is requesting a appointment with Dr.Newton for a injection call back:[phone]

## 2020-06-26 NOTE — Telephone Encounter (Signed)
Ok if criteria met

## 2020-06-30 ENCOUNTER — Other Ambulatory Visit: Payer: Self-pay | Admitting: Physical Medicine and Rehabilitation

## 2020-06-30 DIAGNOSIS — F411 Generalized anxiety disorder: Secondary | ICD-10-CM

## 2020-06-30 MED ORDER — DIAZEPAM 5 MG PO TABS: ORAL_TABLET | ORAL | 0 refills | Status: DC

## 2020-06-30 NOTE — Progress Notes (Signed)
Pre-procedure diazepam ordered for pre-operative anxiety.  

## 2020-06-30 NOTE — Telephone Encounter (Signed)
Done

## 2020-07-10 ENCOUNTER — Ambulatory Visit: Payer: Self-pay

## 2020-07-10 ENCOUNTER — Encounter: Payer: Self-pay | Admitting: Physical Medicine and Rehabilitation

## 2020-07-10 ENCOUNTER — Ambulatory Visit: Payer: 59 | Admitting: Physical Medicine and Rehabilitation

## 2020-07-10 ENCOUNTER — Other Ambulatory Visit: Payer: Self-pay

## 2020-07-10 VITALS — BP 161/82 | HR 78

## 2020-07-10 DIAGNOSIS — M47816 Spondylosis without myelopathy or radiculopathy, lumbar region: Secondary | ICD-10-CM

## 2020-07-10 DIAGNOSIS — M2550 Pain in unspecified joint: Secondary | ICD-10-CM

## 2020-07-10 DIAGNOSIS — M48062 Spinal stenosis, lumbar region with neurogenic claudication: Secondary | ICD-10-CM | POA: Diagnosis not present

## 2020-07-10 DIAGNOSIS — M5416 Radiculopathy, lumbar region: Secondary | ICD-10-CM | POA: Diagnosis not present

## 2020-07-10 DIAGNOSIS — G8929 Other chronic pain: Secondary | ICD-10-CM

## 2020-07-10 DIAGNOSIS — R5383 Other fatigue: Secondary | ICD-10-CM

## 2020-07-10 MED ORDER — BETAMETHASONE SOD PHOS & ACET 6 (3-3) MG/ML IJ SUSP
12.0000 mg | Freq: Once | INTRAMUSCULAR | Status: AC
  Administered 2020-07-10: 12 mg

## 2020-07-10 NOTE — Progress Notes (Signed)
Patient reports extended amount of morning stiffness takes over an hour to get going and he does have a report of really multiple joint pain and stiffness.  Will refer to Dr. Benjamine Mola.  Pt state lower back pain that travels down both hips mostly on the right side. Pt state walking and standing makes the pain worse. Pt state he takes over the counter pain meds. Pt has hx of inj on 12/06/19 Pt state it helped.  Numeric Pain Rating Scale and Functional Assessment Average Pain 7   In the last MONTH (on 0-10 scale) has pain interfered with the following?  1. General activity like being  able to carry out your everyday physical activities such as walking, climbing stairs, carrying groceries, or moving a chair?  Rating(10)   +Driver, -BT, -Dye Allergies.

## 2020-07-10 NOTE — Patient Instructions (Signed)

## 2020-07-12 ENCOUNTER — Encounter: Payer: Self-pay | Admitting: Physical Medicine and Rehabilitation

## 2020-07-12 NOTE — Procedures (Signed)
Lumbar Epidural Steroid Injection - Interlaminar Approach with Fluoroscopic Guidance  Patient: [name] [name]      Date of Birth: May 06, 1950 MRN: [id number] PCP: Iona Beard, MD      Visit Date: 07/10/2020   Universal Protocol:     Consent Given By: the patient  Position: PRONE  Additional Comments: Vital signs were monitored before and after the procedure. Patient was prepped and draped in the usual sterile fashion. The correct patient, procedure, and site was verified.   Injection Procedure Details:   Procedure diagnoses: Lumbar radiculopathy [M54.16]   Meds Administered:  Meds ordered this encounter  Medications  . betamethasone acetate-betamethasone sodium phosphate (CELESTONE) injection 12 mg     Laterality: Right  Location/Site:  L5-S1  Needle: 4.5 in., 20 ga. Tuohy  Needle Placement: Paramedian epidural  Findings:   -Comments: Excellent flow of contrast into the epidural space.  Procedure Details: Using a paramedian approach from the side mentioned above, the region overlying the inferior lamina was localized under fluoroscopic visualization and the soft tissues overlying this structure were infiltrated with 4 ml. of 1% Lidocaine without Epinephrine. The Tuohy needle was inserted into the epidural space using a paramedian approach.   The epidural space was localized using loss of resistance along with counter oblique bi-planar fluoroscopic views.  After negative aspirate for air, blood, and CSF, a 2 ml. volume of Isovue-250 was injected into the epidural space and the flow of contrast was observed. Radiographs were obtained for documentation purposes.    The injectate was administered into the level noted above.   Additional Comments:  The patient tolerated the procedure well Dressing: 2 x 2 sterile gauze and Band-Aid    Post-procedure details: Patient was observed during the procedure. Post-procedure instructions were reviewed.  Patient left the clinic  in stable condition.

## 2020-07-12 NOTE — Progress Notes (Signed)
[name] [name] - 70 y.o. male MRN [id number]  Date of birth: Apr 30, 1950  Office Visit Note: Visit Date: 07/10/2020 PCP: Iona Beard, MD Referred by: Iona Beard, MD  Subjective: Chief Complaint  Patient presents with  . Lower Back - Pain  . Left Hip - Pain  . Right Hip - Pain   HPI: [name] [name] is a 70 y.o. male who comes in today For evaluation and management of chronic worsening low back pain and bilateral hip pain and now onset of chronic worsening allover joint pain with fatigue and significant morning stiffness.  Patient's history can be reviewed in the chart but basically he came to me from Dr. Neomia Dear who I treated him intermittently with injection treatment for his lower spine.  He has stenosis at L2-3 multilevel facet arthropathy no history of back surgery.  The last time I saw him was in the fall of last year and we completed general epidural injection which did give him some relief of his back pain over 50% it did help him to continue to function pretty well.  He does try to stay active but he is hampered by morbid obesity and type 2 diabetes.  He is also had a history that his wife died of liver cancer he has had some depression with that.  He reports to me today that he has been researching joint pain and fatigue and morning stiffness and he wonders if he has something like psoriatic arthritis.  He has pain in the hands and shoulders lower back hip and legs and he does take over an hour in the morning just to get started sometimes and very stiff.  He has worked as a Administrator for [employer].  He has not noted any new trauma any bowel or bladder difficulties or any focal weakness.  Pain in the hips is more lateral hip pain no groin pain.  He has noted what he felt like was some swelling in the joints but no generalized swelling or edema in the legs.  His symptoms in the hips are more right than left.  Is worse with standing and walking.  He uses  over-the-counter pain medications for some relief.  He has not taken chronic pain medications.  His average pain is a 7 out of 10.  He has not had rheumatologic work-up but I did review lab work today.  Family history of chronic medical problems but not specific to rheumatologic disease.  He has not noted any rashes or any other concerning features.  Review of Systems  Constitutional: Positive for malaise/fatigue.  Musculoskeletal: Positive for back pain and joint pain.  All other systems reviewed and are negative.  Otherwise per HPI.  Assessment & Plan: Visit Diagnoses:    ICD-10-CM   1. Lumbar radiculopathy  M54.16 XR C-ARM NO REPORT    Epidural Steroid injection    betamethasone acetate-betamethasone sodium phosphate (CELESTONE) injection 12 mg  2. Spinal stenosis of lumbar region with neurogenic claudication  M48.062   3. Spondylosis without myelopathy or radiculopathy, lumbar region  M47.816   4. Chronic joint pain  M25.50 Ambulatory referral to Rheumatology   G89.29   5. Fatigue, unspecified type  R53.83 Ambulatory referral to Rheumatology     Plan: Findings:  1.  Chronic worsening low back pain with bilateral hip and thigh pain consistent with a combination of facet arthropathy and stenosis.  Last injection at L5-S1 did seem to help him quite a bit so  I think we should repeat that.  That was back in the fall and he did fairly well for a few months.  Consideration given to injection in the upper lumbar region.  Has fairly new MRI.  Pain is probably somewhat modified by the fact that he has had a loss of his wife over the last couple years with some depression.  Consideration to medication trial such as Lyrica or Cymbalta.  2.  Chronic now but with worsening over the last several months of generalized joint pain some history verbally of swelling but no swelling on exam today and generalized fatigue and early morning stiffness more than an hour to get going.  Clearly has osteoarthritic  changes of the spine.  I think would be reasonable to place a referral to Dr. Vernelle Emerald at [facility] for initial consultation.  If the consultation has not excepted we would order some basic rheumatologic laboratory work here versus having him see his primary care physician.    Meds & Orders:  Meds ordered this encounter  Medications  . betamethasone acetate-betamethasone sodium phosphate (CELESTONE) injection 12 mg    Orders Placed This Encounter  Procedures  . XR C-ARM NO REPORT  . Ambulatory referral to Rheumatology  . Epidural Steroid injection    Follow-up: Return if symptoms worsen or fail to improve.   Procedures: No procedures performed  Lumbar Epidural Steroid Injection - Interlaminar Approach with Fluoroscopic Guidance  Patient: [name] [name]      Date of Birth: 03/20/1950 MRN: [id number] PCP: Iona Beard, MD      Visit Date: 07/10/2020   Universal Protocol:     Consent Given By: the patient  Position: PRONE  Additional Comments: Vital signs were monitored before and after the procedure. Patient was prepped and draped in the usual sterile fashion. The correct patient, procedure, and site was verified.   Injection Procedure Details:   Procedure diagnoses: Lumbar radiculopathy [M54.16]   Meds Administered:  Meds ordered this encounter  Medications  . betamethasone acetate-betamethasone sodium phosphate (CELESTONE) injection 12 mg     Laterality: Right  Location/Site:  L5-S1  Needle: 4.5 in., 20 ga. Tuohy  Needle Placement: Paramedian epidural  Findings:   -Comments: Excellent flow of contrast into the epidural space.  Procedure Details: Using a paramedian approach from the side mentioned above, the region overlying the inferior lamina was localized under fluoroscopic visualization and the soft tissues overlying this structure were infiltrated with 4 ml. of 1% Lidocaine without Epinephrine. The Tuohy needle was inserted  into the epidural space using a paramedian approach.   The epidural space was localized using loss of resistance along with counter oblique bi-planar fluoroscopic views.  After negative aspirate for air, blood, and CSF, a 2 ml. volume of Isovue-250 was injected into the epidural space and the flow of contrast was observed. Radiographs were obtained for documentation purposes.    The injectate was administered into the level noted above.   Additional Comments:  The patient tolerated the procedure well Dressing: 2 x 2 sterile gauze and Band-Aid    Post-procedure details: Patient was observed during the procedure. Post-procedure instructions were reviewed.  Patient left the clinic in stable condition.     Clinical History: MRI LUMBAR SPINE WITHOUT CONTRAST  TECHNIQUE: Multiplanar, multisequence MR imaging of the lumbar spine was performed. No intravenous contrast was administered.  COMPARISON:  None.  FINDINGS: Segmentation:  Normal  Alignment:  Mild retrolisthesis L5-S1.  Remaining alignment normal.  Vertebrae: Negative  for fracture. Diffusely decreased signal in the bone marrow on T1 without focal lesion.  Conus medullaris and cauda equina: Conus extends to the L1-2 level. Conus and cauda equina appear normal.  Paraspinal and other soft tissues: Negative for paraspinous mass or adenopathy.  Disc levels:  L1-2: Negative  L2-3: Diffuse bulging of the disc with moderate facet hypertrophy. Moderate spinal stenosis. Moderate subarticular and foraminal stenosis bilaterally.  L3-4: Moderate disc degeneration with disc bulging. Moderate facet hypertrophy bilaterally. Mild spinal stenosis with moderate subarticular stenosis right greater than left.  L4-5: Moderate to advanced disc degeneration with diffuse endplate spurring. Bilateral facet hypertrophy. Mild subarticular stenosis bilaterally  L5-S1: Advanced disc degeneration with disc space narrowing  and endplate spurring. Moderate subarticular stenosis bilaterally. No focal disc protrusion.  IMPRESSION: Multilevel degenerative change throughout the lumbar spine as above. Multilevel spinal and subarticular stenosis bilaterally as described above.  Bone marrow is diffusely abnormal with replacement of fatty marrow . This may be due to diffuse myeloma however no focal lesion is seen. This may also be related to anemia or treatment for multiple myeloma.   Electronically Signed   By: Franchot Gallo M.D.   On: 07/13/2018 09:52   He reports that he has never smoked. He has never used smokeless tobacco. No results for input(s): HGBA1C, LABURIC in the last 8760 hours.  Objective:  VS:  HT:    WT:   BMI:     BP:(!) 161/82  HR:78bpm  TEMP: ( )  RESP:  Physical Exam Vitals and nursing note reviewed.  Constitutional:      General: He is not in acute distress.    Appearance: Normal appearance. He is obese. He is not ill-appearing.  HENT:     Head: Normocephalic and atraumatic.     Right Ear: External ear normal.     Left Ear: External ear normal.     Nose: No congestion.  Eyes:     Extraocular Movements: Extraocular movements intact.  Cardiovascular:     Rate and Rhythm: Normal rate.     Pulses: Normal pulses.  Pulmonary:     Effort: Pulmonary effort is normal. No respiratory distress.  Abdominal:     General: There is no distension.     Palpations: Abdomen is soft.  Musculoskeletal:        General: No tenderness or signs of injury.     Cervical back: Neck supple.     Right lower leg: No edema.     Left lower leg: No edema.     Comments: Patient has good distal strength without clonus. Patient somewhat slow to rise from a seated position to full extension.  There is concordant low back pain with facet loading and lumbar spine extension rotation.  There are no definitive trigger points but the patient is somewhat tender across the lower back and PSIS.  There is no pain  with hip rotation.   Skin:    Findings: No erythema or rash.  Neurological:     General: No focal deficit present.     Mental Status: He is alert and oriented to person, place, and time.     Sensory: No sensory deficit.     Motor: No weakness or abnormal muscle tone.     Coordination: Coordination normal.  Psychiatric:        Mood and Affect: Mood normal.        Behavior: Behavior normal.     Ortho Exam  Imaging: No results found.  Past  Medical/Family/Surgical/Social History: Medications & Allergies reviewed per EMR, new medications updated. Patient Active Problem List   Diagnosis Date Noted  . Diaphragmatic hernia 11/22/2019  . Osteoarthritis 11/22/2019  . Type II or unspecified type diabetes mellitus without mention of complication, uncontrolled 03/18/2017  . Mild nonproliferative diabetic retinopathy ([place]) 03/18/2017  . Nuclear sclerosis 03/18/2017  . ED (erectile dysfunction) of organic origin 10/06/2016  . Elevated prostate specific antigen (PSA) 10/06/2016  . Male hypogonadism 10/06/2016  . Stage 4 chronic kidney disease ([place]) 10/06/2016  . 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 IgA monoclonal gammopathy of uncertain significance 06/03/2016  . CAD (coronary artery disease) 08/17/2010  . Mixed hyperlipidemia 08/17/2010  . Hypertension 08/17/2010  . OSA (obstructive sleep apnea) 08/17/2010  . Morbid obesity ([place]) 08/17/2010  . Esophageal reflux 06/19/2001  . 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 Hypertrophy of prostate without urinary obstruction and other lower urinary tract symptoms (LUTS) 04/30/1999  . Proteinuria 04/30/1999   Past Medical History:  Diagnosis Date  . Coronary artery disease    Nonobstructive per cath in 2005 and 2006 with 50% LAD stenosis, rechecked with IVUS  during cath in 2006.  . Diabetes mellitus   . Hyperlipidemia   . Hypertension   . Obesity, morbid ([place])   . OSA (obstructive sleep apnea)    Uses CPAP   Family  History  Problem Relation Age of Onset  . Heart failure Mother   . Hypertension Mother   . Diabetes Mother   . Stroke Father   . Hypertension Father   . Hypertension Sister   . Hypertension Brother    Past Surgical History:  Procedure Laterality Date  . CHOLECYSTECTOMY     Social History   Occupational History  . Not on file  Tobacco Use  . Smoking status: Never Smoker  . Smokeless tobacco: Never Used  Substance and Sexual Activity  . Alcohol use: No  . Drug use: No  . Sexual activity: Yes

## 2020-09-03 NOTE — Progress Notes (Signed)
Office Visit Note  Patient: [name] [name]             Date of Birth: 20-May-1950           MRN: [id number]             PCP: Majel Homer, PA Referring: Magnus Sinning, MD Visit Date: 09/04/2020 Occupation: Games developer  Subjective:  New Patient (Initial Visit) (Patient complains of low back pain with radiation to bilateral lower extremities. Patient also complains of bilateral shoulder pain and bilateral hand pain and stiffness. )   History of Present Illness: [name] MCCANNON is a 70 y.o. male here for evaluation of chornic joint pain in multiple sites. He has a known history of bilateral knee osteoarthritis and for low back degenerative changes with radiculopathy.  He has had knee pain since several years ago but current problems are worst in the low back that started since about a year ago.  He has significant pain midline in the low back worsened with back extension with pain radiation down both legs posteriorly to about the knee level.  He had an injection with Dr. Ernestina Patches for this that did not significantly improve his symptoms.  He also reports since about 6 or 7 months ago increased problems with pain and stiffness of his bilateral shoulders and hands.  He notices swelling of the hands intermittently.  Symptoms are bothersome for work but does not prevent driving, the back pain is worst at night or when trying to stand fully upright to walk.  He notices some improvement during the day with decreased stiffness but takes more than an hour in the morning.  He is not able to take NSAIDs due to his chronic kidney disease and he finds no significant relief with Tylenol.  Imaging reviewed 11/30/2018 Xray knees bilateral Tricompartment osteoarthritis worst in patellofemoral and medial compartments of both knees  07/13/18 MRI Lumbar spine IMPRESSION: Multilevel degenerative change throughout the lumbar spine as above. Multilevel spinal and subarticular stenosis bilaterally as described  above. Bone marrow is diffusely abnormal with replacement of fatty marrow. This may be due to diffuse myeloma however no focal lesion is seen. This may also be related to anemia or treatment for multiple myeloma.    Activities of Daily Living:  Patient reports morning stiffness for several hours.   Patient Denies nocturnal pain.  Difficulty dressing/grooming: Reports Difficulty climbing stairs: Reports Difficulty getting out of chair: Reports Difficulty using hands for taps, buttons, cutlery, and/or writing: Denies  Review of Systems  Constitutional:  Positive for fatigue.  HENT:  Negative for mouth sores, mouth dryness and nose dryness.   Eyes:  Positive for itching, visual disturbance and dryness. Negative for pain.  Respiratory:  Positive for shortness of breath. Negative for cough, hemoptysis and difficulty breathing.   Cardiovascular:  Negative for chest pain, palpitations and swelling in legs/feet.  Gastrointestinal:  Positive for constipation. Negative for abdominal pain, blood in stool and diarrhea.  Endocrine: Negative for increased urination.  Genitourinary:  Negative for painful urination.  Musculoskeletal:  Positive for joint pain, joint pain, myalgias, muscle weakness, morning stiffness, muscle tenderness and myalgias. Negative for joint swelling.  Skin:  Positive for rash. Negative for color change and redness.  Allergic/Immunologic: Negative for susceptible to infections.  Neurological:  Positive for numbness and weakness. Negative for dizziness, headaches and memory loss.  Hematological:  Negative for swollen glands.  Psychiatric/Behavioral:  Positive for sleep disturbance. Negative for confusion.    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/06/2016   Elevated prostate specific antigen (PSA) 10/06/2016   Male hypogonadism 10/06/2016   Stage 4 chronic kidney disease ([place]) 10/06/2016   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   IgA monoclonal gammopathy of uncertain significance 06/03/2016   CAD (coronary artery disease) 08/17/2010   Mixed hyperlipidemia 08/17/2010   Hypertension 08/17/2010   OSA (obstructive sleep apnea) 08/17/2010   Morbid obesity ([place]) 08/17/2010   Esophageal reflux 06/19/2001   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   Diabetes mellitus    Hyperlipidemia    Hypertension    Obesity, morbid ([facility])    OSA (obstructive sleep apnea)    Uses CPAP   Stage 4 chronic kidney disease ([place])    Per patient    Family History  Problem Relation Age of Onset   Heart failure Mother    Hypertension Mother    Diabetes Mother    Arthritis Mother    Stroke Father    Hypertension Father    Diabetes Sister    Hypertension Sister    Obesity Sister    Obesity Brother    Diabetes Brother    Hypertension Brother    Healthy Daughter    Past Surgical History:  Procedure Laterality Date   CHOLECYSTECTOMY     NASAL SINUS SURGERY     Social History   Social History Narrative   Not on file    There is no immunization history on file for this patient.   Objective: Vital Signs: BP 125/79 (BP Location: Right Arm, Patient Position: Sitting, Cuff Size: Normal)   Pulse 77   Ht _0  (1.676 m)   Wt  294 lb (133.4 kg)   BMI 47.45 kg/m    Physical Exam Constitutional:      Appearance: He is obese.  Eyes:     Conjunctiva/sclera: Conjunctivae normal.  Cardiovascular:     Rate and Rhythm: Normal rate and regular rhythm.  Pulmonary:     Effort: Pulmonary effort is normal.     Breath sounds: Normal breath sounds.  Skin:    General: Skin is warm and dry.     Findings: No rash.  Neurological:     General: No focal deficit present.     Mental Status: He is alert.  Psychiatric:        Mood and Affect: Mood normal.     Musculoskeletal Exam:  Neck provokes back pain with full extension otherwise normal range of motion Shoulders full ROM some pain with extension behind back Elbows full ROM no tenderness or swelling Wrists full ROM no tenderness or swelling Fingers slightly decreased flexion ROM, chronic joint enlargement around MCPs but no palpable synovitis Midline tenderness over low back just above sacrum Hip rotation partially decreased internal rotation ROM, FABER maneuver  provokes low back pain Knees decreased flexion and extension ROM bilaterally, crepitus present, no effusion Ankles full ROM no tenderness or swelling   Investigation: No additional findings.  Imaging: XR Hand 2 View Left  Result Date: 09/04/2020 X-ray left hand 2 views Radiocarpal joint space narrowing.  Surface of ulnar head is irregular but intact space.  Several cystic changes and degenerative change of the [facility] joint.  MCP joint narrowing at the third digit with early hook osteophyte changes.  PIP and DIP joint spaces appear intact.  No discrete erosions are seen. Impression Arthritis changes most advanced at the wrist and third MCP joints with cystic changes and early hanging osteophytes suggestive for possibly secondary OA or crystalline arthropathy.  XR Hand 2 View Right  Result Date: 09/04/2020 X-ray right hand 2 views Radiocarpal joint space narrowing ulnar styloid is absent with prominent  cystic change in ulnar head.  Some probable cystic change in carpal bones.  Cystic changes in probable early hanging osteophytes of MCP joints with narrowing of the third joint space.  PIP and DIP joint spaces appear well-preserved.  No discrete erosions seen. 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 This calculation has not been validated in all clinical situations. eGFR's persistently <60 mL/min signify possible Chronic Kidney Disease.    Speciality Comments: No specialty comments available.  Procedures:  No procedures performed Allergies: Olmesartan   Assessment / Plan:     Visit Diagnoses: Joint stiffness - Plan: Sedimentation rate, Uric acid, HLA-B27 antigen, Rheumatoid factor  Joint pain in multiple areas with daily stiffness lasting greater than 1 hour duration there are some chronic changes in the proximal joints of both hands suggestive for some inflammation although no frank synovitis present on exam.  We will check rheumatoid factor HLA-B27 uric acid and sedimentation rate causes.  Primary osteoarthritis involving multiple joints - Plan: XR Hand 2 View Right, XR Hand 2 View Left  Certainly degenerative arthritis of multiple areas axial and peripheral will check bilateral  hand xrays for any evidence of secondary causes or erosive diseases.  Chronic midline low back pain with bilateral sciatica  Chronic, currently treating with injection by Dr. Ernestina Patches this problem seems very mechanical and disc related with pain described markedly increased by extension range of movements.  Stage 4 chronic kidney disease (HCC)  Chronic kidney disease can be associated with osteodystrophy, increased risk of crystalline arthropathy, also complication for treatments and makes systemic NSAIDs unacceptable. Working up as above ideally would have a site for possible aspiration sometime.  Orders: Orders Placed This Encounter  Procedures   XR Hand 2 View Right   XR Hand 2 View Left  Sedimentation rate   Uric acid   HLA-B27 antigen   Rheumatoid factor    No orders of the defined types were placed in this encounter.   Follow-Up Instructions: No follow-ups on file.   Collier Salina, MD  Note - This record has been created using [employer].  Chart creation errors have been sought, but may not always  have been located. Such creation errors do not reflect on  the standard of medical care.

## 2020-09-04 ENCOUNTER — Ambulatory Visit: Payer: Self-pay

## 2020-09-04 ENCOUNTER — Encounter: Payer: Self-pay | Admitting: Internal Medicine

## 2020-09-04 ENCOUNTER — Ambulatory Visit: Payer: 59 | Admitting: Internal Medicine

## 2020-09-04 ENCOUNTER — Other Ambulatory Visit: Payer: Self-pay

## 2020-09-04 VITALS — BP 125/79 | HR 77 | Ht 66.0 in | Wt 294.0 lb

## 2020-09-04 DIAGNOSIS — N184 Chronic kidney disease, stage 4 (severe): Secondary | ICD-10-CM

## 2020-09-04 DIAGNOSIS — G8929 Other chronic pain: Secondary | ICD-10-CM | POA: Insufficient documentation

## 2020-09-04 DIAGNOSIS — M5441 Lumbago with sciatica, right side: Secondary | ICD-10-CM | POA: Diagnosis not present

## 2020-09-04 DIAGNOSIS — M8949 Other hypertrophic osteoarthropathy, multiple sites: Secondary | ICD-10-CM

## 2020-09-04 DIAGNOSIS — M2560 Stiffness of unspecified joint, not elsewhere classified: Secondary | ICD-10-CM | POA: Insufficient documentation

## 2020-09-04 DIAGNOSIS — M79642 Pain in left hand: Secondary | ICD-10-CM | POA: Diagnosis not present

## 2020-09-04 DIAGNOSIS — M159 Polyosteoarthritis, unspecified: Secondary | ICD-10-CM

## 2020-09-04 DIAGNOSIS — M79641 Pain in right hand: Secondary | ICD-10-CM

## 2020-09-04 DIAGNOSIS — M5442 Lumbago with sciatica, left side: Secondary | ICD-10-CM

## 2020-09-05 LAB — RHEUMATOID FACTOR: Rheumatoid fact SerPl-aCnc: <14 IU/mL (ref <14)

## 2020-09-05 LAB — HLA-B27 ANTIGEN: HLA-B27 Antigen: NEGATIVE

## 2020-09-05 LAB — URIC ACID: Uric Acid, Serum: 5.8 mg/dL (ref 4.0–8.0)

## 2020-09-05 LAB — SEDIMENTATION RATE: Sed Rate: 38 mm/h — ABNORMAL HIGH (ref 0–20)

## 2020-09-06 NOTE — Progress Notes (Signed)
Blood tests are negative for evidence of rheumatoid arthritis or gout. His sedimentation rate is increased and xrays show some changes that could reflect long term joint inflammation. 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 I would like to see him back whenever available to take another look including ultrasound inspection for evidence of this and discuss what medications we could try.

## 2020-09-07 ENCOUNTER — Telehealth: Payer: Self-pay

## 2020-09-07 NOTE — Telephone Encounter (Signed)
Patient left a voicemail requesting a return call regarding his labwork results.   Phone [phone]

## 2020-09-11 ENCOUNTER — Encounter: Payer: Self-pay | Admitting: Internal Medicine

## 2020-09-11 ENCOUNTER — Other Ambulatory Visit: Payer: Self-pay

## 2020-09-11 ENCOUNTER — Ambulatory Visit: Payer: 59 | Admitting: Internal Medicine

## 2020-09-11 VITALS — BP 130/76 | HR 74 | Ht 66.0 in | Wt 297.4 lb

## 2020-09-11 DIAGNOSIS — G8929 Other chronic pain: Secondary | ICD-10-CM

## 2020-09-11 DIAGNOSIS — M5441 Lumbago with sciatica, right side: Secondary | ICD-10-CM

## 2020-09-11 DIAGNOSIS — M2560 Stiffness of unspecified joint, not elsewhere classified: Secondary | ICD-10-CM | POA: Diagnosis not present

## 2020-09-11 DIAGNOSIS — M5442 Lumbago with sciatica, left side: Secondary | ICD-10-CM | POA: Diagnosis not present

## 2020-09-11 DIAGNOSIS — N184 Chronic kidney disease, stage 4 (severe): Secondary | ICD-10-CM | POA: Diagnosis not present

## 2020-09-11 NOTE — Patient Instructions (Addendum)
I am suspicious your joint symptoms could be from joint crystals causing inflammation possibly from gout but more likely a problem known as pseudogout, where calcium crystals instead of uric acid crystals cause symptoms. We can take another look if this flares up again to be sure if this is correct though and recommendations for treatment.  Calcium Pyrophosphate Deposition Calcium pyrophosphate deposition (CPPD) is a type of arthritis that causes pain, swelling, and inflammation in a joint. Attacks of CPPD may come and go. The joint pain can be severe and may last for days to weeks. This condition usually affects one joint at a time. The knees are most often affected, butthis condition can also affect the wrists, elbows, shoulders, or ankles. CPPD may also be called pseudogout because it is similar to gout. Both conditions result from the buildup of crystals in a joint. However, CPPD iscaused by a type of crystal that is different from the crystals that cause gout. What are the causes? This condition is caused by the buildup of calcium pyrophosphate dihydrate crystals in a joint. The reason why this buildup occurs is not known. An increased likelihood of having this condition (predisposition) may be passed from parent to child (is hereditary). What increases the risk? This condition is more likely to develop in people who: Are older than 68 years of age. Have a family history of CPPD. Have certain medical conditions, such as hemophilia, amyloidosis, or overactive parathyroid glands. Have low levels of magnesium in the blood. What are the signs or symptoms? Symptoms of this condition include: Joint pain. The pain may: Be intense and constant. Develop quickly. Get worse with movement. Last from several days to a few weeks. Redness, swelling, stiffness, and warmth at the joint. How is this diagnosed? To diagnose this condition, your health care provider will use a needle to remove fluid from the  joint. The fluid will be examined for the crystals that cause CPPD. You also may have additional tests, such as: Blood tests. X-rays. Ultrasound. MRI. How is this treated? There is no way to remove the crystals from the joint and no cure for this condition. However, treatment can relieve symptoms and improve joint function. Treatment may include: NSAIDs to reduce inflammation and pain. Removing some of the fluid and crystals from around the joint with a needle. Injections of medicine (cortisone) into the joint to reduce pain and swelling. Medicines to help prevent attacks. Physical therapy to improve joint function. Follow these instructions at home: Managing pain, stiffness, and swelling  Rest the affected joint until your symptoms start to go away. If directed, put ice on the affected area to relieve pain and swelling: Put ice in a plastic bag. Place a towel between your skin and the bag. Leave the ice on for 20 minutes, 2-3 times a day. Keep your affected joint raised (elevated) above the level of your heart, when possible. This will help to reduce swelling. For example, prop your foot up on a chair while sitting down to elevate your knee.  General instructions If the painful joint is in your leg, use crutches as told by your health care provider. Take over-the-counter and prescription medicines only as told by your health care provider. When your symptoms start to go away, begin to exercise regularly or do physical therapy. Talk with your health care provider or physical therapist about what types of exercise are safe for you. Exercise that is easier on your joints (low-impact exercise) may be best. This includes walking, swimming,  bicycling, and water aerobics. Maintain a healthy weight. Excess weight puts stress on your joints. Keep all follow-up visits as told by your health care provider and physical therapist. This is important. Contact a health care provider if you: Notice that  your symptoms get worse. Develop a skin rash. Notice that your pain gets worse. Get help right away if you: Have a fever. Have difficulty breathing. Are taking NSAIDs and you: Vomit blood. Have blood in your stool. Have stool that is tarry and black. Summary Calcium pyrophosphate deposition (CPPD) is a type of arthritis that causes pain, swelling, and inflammation in a joint. The knees are most often affected, but it can also affect the wrists, elbows, shoulders, or ankles. CPPD is caused by the buildup of calcium crystals in a joint. The reason why this occurs is not known. Attacks of CPPD may come and go. The joint pain can be severe and may last for days to weeks. There is no way to remove the crystals from the joint and no cure for this condition. However, treatment can relieve symptoms and improve joint function. Rest the affected joint until your symptoms start to go away. After your symptoms go away, begin to exercise regularly or do physical therapy. This information is not intended to replace advice given to you by your health care provider. Make sure you discuss any questions you have with your healthcare provider. Document Revised: 06/08/2019 Document Reviewed: 06/08/2019 Elsevier Patient Education  [place].

## 2020-09-11 NOTE — Progress Notes (Signed)
Office Visit Note  Patient: [name] [name]             Date of Birth: Mar 26, 1950           MRN: [id number]             PCP: Majel Homer, PA Referring: Majel Homer, [place] Visit Date: 09/11/2020   Subjective:  Follow-up (Patient denies changes in symptoms. Patient presents today to discuss treatment options and for an ultrasound potentially.)   History of Present Illness: [name] [name] is a 70 y.o. male here for follow up for multiple joint pains with chronic degenerative low back disease but also with newer complaints of progressive pain and stiffness in bilateral shoulders and hands. Labs and imaging at our initial visit showed mildly elevated sedimentation rate otherwise negative serology and xrays appeared consistent with secondary and proximal degenerative changes and periarticular calcifications, although no TFCC chondrocalcinosis.  Since her last visit symptoms remain about the same his worst area of joint pains currently in the low back though his shoulder and hand symptoms persist.   Previous HPI: 09/04/20 [name] [name] is a 70 y.o. male here for evaluation of chronic joint pain in multiple sites. He has a known history of bilateral knee osteoarthritis and for low back degenerative changes with radiculopathy.  He has had knee pain since several years ago but current problems are worst in the low back that started since about a year ago.  He has significant pain midline in the low back worsened with back extension with pain radiation down both legs posteriorly to about the knee level.  He had an injection with Dr. Ernestina Patches for this that did not significantly improve his symptoms.  He also reports since about 6 or 7 months ago increased problems with pain and stiffness of his bilateral shoulders and hands.  He notices swelling of the hands intermittently.  Symptoms are bothersome for work but does not prevent driving, the back pain is worst at night or when trying to stand fully upright  to walk.  He notices some improvement during the day with decreased stiffness but takes more than an hour in the morning.  He is not able to take NSAIDs due to his chronic kidney disease and he finds no significant relief with Tylenol.   Imaging reviewed 11/30/2018 Xray knees bilateral Tricompartment osteoarthritis worst in patellofemoral and medial compartments of both knees   07/13/18 MRI Lumbar spine IMPRESSION: Multilevel degenerative change throughout the lumbar spine as above. Multilevel spinal and subarticular stenosis bilaterally as described above. Bone marrow is diffusely abnormal with replacement of fatty marrow. This may be due to diffuse myeloma however no focal lesion is seen. This may also be related to anemia or treatment for multiple myeloma.   Review of Systems  Constitutional:  Negative for fatigue.  HENT:  Negative for mouth sores, mouth dryness and nose dryness.   Eyes:  Positive for itching. Negative for pain, visual disturbance and dryness.  Respiratory:  Positive for shortness of breath. Negative for cough, hemoptysis and difficulty breathing.   Cardiovascular:  Negative for chest pain, palpitations and swelling in legs/feet.  Gastrointestinal:  Positive for constipation. Negative for abdominal pain, blood in stool and diarrhea.  Endocrine: Negative for increased urination.  Genitourinary:  Negative for painful urination.  Musculoskeletal:  Positive for joint pain, joint pain, myalgias, morning stiffness, muscle tenderness and myalgias. Negative for joint swelling and muscle weakness.  Skin:  Negative for color change, rash and redness.  Allergic/Immunologic: Negative for susceptible to infections.  Neurological:  Negative for dizziness, numbness, headaches, memory loss and weakness.  Hematological:  Negative for swollen glands.  Psychiatric/Behavioral:  Negative for confusion and sleep disturbance.    PMFS History:  Patient Active Problem List   Diagnosis Date  Noted   Chronic low back pain with bilateral sciatica 09/04/2020   Joint stiffness 09/04/2020   Diaphragmatic hernia 11/22/2019   Osteoarthritis 11/22/2019   Type II or unspecified type diabetes mellitus without mention of complication, uncontrolled 03/18/2017   Mild nonproliferative diabetic retinopathy ([place]) 03/18/2017   Nuclear sclerosis 03/18/2017   ED (erectile dysfunction) of organic origin 10/06/2016   Elevated prostate specific antigen (PSA) 10/06/2016   Male hypogonadism 10/06/2016   Stage 4 chronic kidney disease ([place]) 10/06/2016   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   IgA monoclonal gammopathy of uncertain significance 06/03/2016   CAD (coronary artery disease) 08/17/2010   Mixed hyperlipidemia 08/17/2010   Hypertension 08/17/2010   OSA (obstructive sleep apnea) 08/17/2010   Morbid obesity ([place]) 08/17/2010   Esophageal reflux 06/19/2001   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   Diabetes mellitus    Hyperlipidemia    Hypertension    Obesity, morbid (HCC)    OSA (obstructive sleep apnea)    Uses CPAP   Stage 4 chronic kidney disease ([place])    Per patient    Family History  Problem Relation Age of Onset   Heart failure Mother    Hypertension Mother    Diabetes Mother    Arthritis Mother    Stroke Father    Hypertension Father    Diabetes Sister    Hypertension Sister    Obesity Sister    Obesity Brother    Diabetes Brother    Hypertension Brother    Healthy Daughter    Past Surgical History:  Procedure Laterality Date   CHOLECYSTECTOMY     NASAL SINUS SURGERY     Social History   Social History Narrative   Not on file     There is no immunization history on file for this patient.   Objective: Vital Signs: BP 130/76 (BP Location: Left Arm, Patient Position: Sitting, Cuff Size: Large)   Pulse 74   Ht 5' 6"  (1.676 m)   Wt 297 lb 6.4 oz (134.9 kg)   BMI 48.00 kg/m    Physical Exam Constitutional:      Appearance: He is obese.  Skin:    General: Skin is warm and dry.     Findings: No rash.  Neurological:     Mental Status: He is alert.  Psychiatric:        Mood and Affect: Mood normal.     Musculoskeletal Exam:  Shoulders full ROM anterior mild pain provoked with extension behind the back Elbows full ROM no tenderness or swelling Wrists full ROM no tenderness or swelling Fingers slightly decreased flexion ROM, chronic joint enlargement around MCPs but no palpable synovitis, ultrasound inspection on both hands was negative for effusions negative for color Doppler enhancement Knees decreased flexion and extension ROM bilaterally, bilateral patellofemoral crepitus present Ankles full ROM no tenderness or swelling   Investigation: No  additional findings.  Imaging: XR Hand 2 View Left  Result Date: 09/04/2020 X-ray left hand 2 views Radiocarpal joint space narrowing.  Surface of ulnar head is irregular but intact space.  Several cystic changes and degenerative change of the first [facility] joint.  MCP joint narrowing at the third digit with early hook osteophyte changes.  PIP and DIP joint spaces appear intact.  No discrete erosions are seen. Impression Arthritis changes most advanced at the wrist and third MCP joints with cystic changes and early hanging osteophytes suggestive for possibly secondary OA or crystalline arthropathy.  XR Hand 2 View Right  Result Date: 09/04/2020 X-ray right hand 2 views Radiocarpal joint space narrowing ulnar styloid is absent with prominent cystic change in ulnar head.  Some probable cystic change in carpal bones.  Cystic changes in probable early hanging osteophytes of  MCP joints with narrowing of the third joint space.  PIP and DIP joint spaces appear well-preserved.  No discrete erosions seen. 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 This calculation has not been validated in all clinical situations. eGFR's persistently <60 mL/min signify possible Chronic Kidney Disease.    Speciality Comments: No specialty comments available.  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  No labs or imaging findings to support rheumatoid arthritis or gout at this time.  With no inflammatory serology and no active synovitis on exam and ultrasound inspection today would not recommend DMARD treatment.  His chronic kidney disease would make empiric colchicine or hydroxychloroquine less safe so not recommending treatment started unless inflammatory symptoms become more severe would like to see him at that time.  Chronic midline low back pain with bilateral sciatica  Recommend he continue follow-up with Dr. Ernestina Patches for orthopedics clinic for  ongoing chronic low back pain.  This appears to be a separate problem predating this newer complaint by years.  Orders: No orders of the defined types were placed in this encounter.  No orders of the defined types were placed in this encounter.    Follow-Up Instructions: Return if symptoms worsen or fail to improve.   Collier Salina, MD  Note - This record has been created using [employer].  Chart creation errors have been sought, but may not always  have been located. Such creation errors do not reflect on  the standard of medical care.

## 2021-04-20 ENCOUNTER — Telehealth: Payer: Self-pay | Admitting: Physical Medicine and Rehabilitation

## 2021-04-20 NOTE — Telephone Encounter (Signed)
Patient called. He would like an appointment with Dr. Ernestina Patches. His call back number is [phone]

## 2021-04-30 ENCOUNTER — Ambulatory Visit: Payer: 59 | Admitting: Physical Medicine and Rehabilitation

## 2021-04-30 ENCOUNTER — Other Ambulatory Visit: Payer: Self-pay

## 2021-04-30 ENCOUNTER — Encounter: Payer: Self-pay | Admitting: Physical Medicine and Rehabilitation

## 2021-04-30 VITALS — BP 158/70 | HR 80

## 2021-04-30 DIAGNOSIS — M47816 Spondylosis without myelopathy or radiculopathy, lumbar region: Secondary | ICD-10-CM | POA: Diagnosis not present

## 2021-04-30 DIAGNOSIS — M5416 Radiculopathy, lumbar region: Secondary | ICD-10-CM | POA: Diagnosis not present

## 2021-04-30 DIAGNOSIS — M48062 Spinal stenosis, lumbar region with neurogenic claudication: Secondary | ICD-10-CM

## 2021-04-30 NOTE — Progress Notes (Signed)
[name] [name] - 72 y.o. male MRN [id number]  Date of birth: 06/27/1950  Office Visit Note: Visit Date: 04/30/2021 PCP: Majel Homer, PA Referred by: Majel Homer, PA  Subjective: Chief Complaint  Patient presents with   Lower Back - Pain   Right Leg - Pain   Left Leg - Pain   Right Knee - Pain   Left Knee - Pain   HPI: [name] [name] is a 71 y.o. male who comes in today for evaluation of chronic, worsening and severe bilateral lower back pain radiating to buttocks and down posterior legs, also reports numbness/tingling sensation to right anterior thigh. Patient reports pain has been ongoing for several years, however his pain is now much more severe. Patient states pain is exacerbated by prolonged standing and walking, describes as throbbing, sore and tingling sensation, currently rates as 8 out of 10. Patient reports some relief of pain with home exercise regimen, rest/sitting and use of Tylenol as needed. Patient has attended formal chiropractic treatments in the past with Dr. Marlou Starks in [place], [place] and reports no relief of pain with these therapies. Patients lumbar MRI from 2020 exhibits multi-level facet hypertrophy, moderate spinal canal stenosis at L2-L3 and mild spinal canal stenosis at L3-L4. Patient had right L5-S1 interlaminar epidural steroid injection performed by Dr. Magnus Sinning in May of 2022 and reports minimal pain relief for 2 days. Patient states he continues to see Dr. Niel Hummer at [facility] where he had platelet-rich protein (PRP) injections to bilateral knees several months ago. 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 Patient denies focal weakness. Patient denies recent trauma or falls.   Review of Systems  Musculoskeletal:  Positive for back pain.  Neurological:  Positive for tingling. Negative for sensory change,  focal weakness and weakness.  All other systems reviewed and are negative. Otherwise per HPI.  Assessment & Plan: Visit Diagnoses:    ICD-10-CM   1. Lumbar radiculopathy  M54.16 MR LUMBAR SPINE WO CONTRAST    2. Spinal stenosis of lumbar region with neurogenic claudication  M48.062 MR LUMBAR SPINE WO CONTRAST    3. Spondylosis without myelopathy or radiculopathy, lumbar region  M47.816 MR LUMBAR SPINE WO CONTRAST       Plan: Findings:  Chronic, worsening and severe bilateral lower back pain radiating to buttocks and down posterior legs, also numbness/tingling sensation to right anterior thigh region. Patient continues to have excruciating and debilitating pain despite good conservative therapies such as home exercise regimen, rest/sitting, use of medications and formal chiropractic treatments. Patients clinical presentation and exam are clinically complex, however we feel his symptoms could be related to neurogenic claudication from worsening spinal canal stenosis. We are also concerned he could have a new disc herniation contributing to his pain. We feel the next step is to obtain new lumbar MRI imaging. We will have patient follow up with [place] after lumbar MRI imaging is obtained for review and to discuss treatment options. Patient encouraged to continue home exercise regimen as tolerated. No red flag symptoms noted upon exam today.    Meds & Orders: No orders of the defined types were placed in this encounter.   Orders Placed This Encounter  Procedures   MR LUMBAR SPINE WO CONTRAST    Follow-up: Return for follow up after lumbar MRI imaging is obtained for review.   Procedures: No procedures performed  Clinical History: MRI LUMBAR SPINE WITHOUT CONTRAST   TECHNIQUE: Multiplanar, multisequence MR imaging of the lumbar spine was performed. No intravenous contrast was administered.   COMPARISON:  None.   FINDINGS: Segmentation:  Normal   Alignment:  Mild retrolisthesis  L5-S1.  Remaining alignment normal.   Vertebrae: Negative for fracture. Diffusely decreased signal in the bone marrow on T1 without focal lesion.   Conus medullaris and cauda equina: Conus extends to the L1-2 level. Conus and cauda equina appear normal.   Paraspinal and other soft tissues: Negative for paraspinous mass or adenopathy.   Disc levels:   L1-2: Negative   L2-3: Diffuse bulging of the disc with moderate facet hypertrophy. Moderate spinal stenosis. Moderate subarticular and foraminal stenosis bilaterally.   L3-4: Moderate disc degeneration with disc bulging. Moderate facet hypertrophy bilaterally. Mild spinal stenosis with moderate subarticular stenosis right greater than left.   L4-5: Moderate to advanced disc degeneration with diffuse endplate spurring. Bilateral facet hypertrophy. Mild subarticular stenosis bilaterally   L5-S1: Advanced disc degeneration with disc space narrowing and endplate spurring. Moderate subarticular stenosis bilaterally. No focal disc protrusion.   IMPRESSION: Multilevel degenerative change throughout the lumbar spine as above. Multilevel spinal and subarticular stenosis bilaterally as described above.   Bone marrow is diffusely abnormal with replacement of fatty marrow . This may be due to diffuse myeloma however no focal lesion is seen. This may also be related to anemia or treatment for multiple myeloma.     Electronically Signed   By: Franchot Gallo M.D.   On: 07/13/2018 09:52   He reports that he quit smoking about 48 years ago. His smoking use included cigarettes. He has never used smokeless tobacco.  Recent Labs    09/04/20 1058  LABURIC 5.8    Objective:  VS:  HT:     WT:    BMI:      BP: (!) 158/70   HR:80bpm   TEMP: ( )   RESP:  Physical Exam Vitals and nursing note reviewed.  HENT:     Head: Normocephalic and atraumatic.     Right Ear: External ear normal.     Left Ear: External ear normal.     Nose: Nose  normal.     Mouth/Throat:     Mouth: Mucous membranes are moist.  Eyes:     Extraocular Movements: Extraocular movements intact.  Cardiovascular:     Rate and Rhythm: Normal rate.     Pulses: Normal pulses.  Pulmonary:     Effort: Pulmonary effort is normal.  Abdominal:     General: Abdomen is flat. There is no distension.  Musculoskeletal:        General: Tenderness present.     Cervical back: Normal range of motion.     Comments: Pt is slow to rise from seated position to standing. Good lumbar range of motion. Strong distal strength without clonus, no pain upon palpation of greater trochanters. Sensation intact bilaterally. Walks independently, gait steady.   Skin:    General: Skin is warm and dry.     Capillary Refill: Capillary refill takes less than 2 seconds.  Neurological:     General: No focal deficit present.     Mental Status: He is alert and oriented to person, place, and time.  Psychiatric:        Mood and Affect: Mood normal.    Ortho Exam  Imaging: No results found.  Past Medical/Family/Surgical/Social History: Medications & Allergies reviewed per EMR, new medications updated.  Patient Active Problem List   Diagnosis Date Noted   Chronic low back pain with bilateral sciatica 09/04/2020   Joint stiffness 09/04/2020   Diaphragmatic hernia 11/22/2019   Osteoarthritis 11/22/2019   Type II or unspecified type diabetes mellitus without mention of complication, uncontrolled 03/18/2017   Mild nonproliferative diabetic retinopathy ([place]) 03/18/2017   Nuclear sclerosis 03/18/2017   ED (erectile dysfunction) of organic origin 10/06/2016   Elevated prostate specific antigen (PSA) 10/06/2016   Male hypogonadism 10/06/2016   Stage 4 chronic kidney disease ([place]) 10/06/2016   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   IgA monoclonal gammopathy of uncertain significance 06/03/2016   CAD (coronary artery disease) 08/17/2010   Mixed hyperlipidemia 08/17/2010    Hypertension 08/17/2010   OSA (obstructive sleep apnea) 08/17/2010   Morbid obesity ([place]) 08/17/2010   Esophageal reflux 06/19/2001   Hypersomnia with sleep apnea 04/30/2000   Hypertrophy of prostate without urinary obstruction and other lower urinary tract symptoms (LUTS) 04/30/1999   Proteinuria 04/30/1999   Past Medical History:  Diagnosis Date   Coronary artery disease    Nonobstructive per cath in 2005 and 2006 with 50% LAD stenosis, rechecked with IVUS  during cath in 2006.   Diabetes mellitus    Hyperlipidemia    Hypertension    Obesity, morbid (HCC)    OSA (obstructive sleep apnea)    Uses CPAP   Stage 4 chronic kidney disease ([place])    Per patient   Family History  Problem Relation Age of Onset   Heart failure Mother    Hypertension Mother    Diabetes Mother    Arthritis Mother    Stroke Father    Hypertension Father    Diabetes Sister    Hypertension Sister    Obesity Sister    Obesity Brother    Diabetes Brother    Hypertension Brother    Healthy Daughter    Past Surgical History:  Procedure Laterality Date   CHOLECYSTECTOMY     NASAL SINUS SURGERY     Social History   Occupational History   Not on file  Tobacco Use   Smoking status: Former    Types: Cigarettes    Quit date: 1975    Years since quitting: 48.2   Smokeless tobacco: Never  Vaping Use   Vaping Use: Never used  Substance and Sexual Activity   Alcohol use: Yes    Comment: Very rarely   Drug use: No   Sexual activity: Yes

## 2021-04-30 NOTE — Progress Notes (Signed)
Pt state lower back pain that travels down the posterior of both legs and to his knees. Pt state any movement makes the pain worse. Pt state he takes over the counter pain meds and uses ice to help ease his pain. Pt state hx of inj on 07/10/20 pt state it helped for two days. ? ?Numeric Pain Rating Scale and Functional Assessment ?Average Pain 10 ?Pain Right Now 7 ?My pain is constant, sharp, stabbing, and aching ?Pain is worse with: walking, bending, sitting, standing, some activites, and Laying down ?Pain improves with: heat/ice, medication, and injections ? ? ?In the last MONTH (on 0-10 scale) has pain interfered with the following? ? ?1. General activity like being  able to carry out your everyday physical activities such as walking, climbing stairs, carrying groceries, or moving a chair?  ?Rating(7) ? ?2. Relation with others like being able to carry out your usual social activities and roles such as  activities at home, at work and in your community. ?Rating(8) ? ?3. Enjoyment of life such that you have  been bothered by emotional problems such as feeling anxious, depressed or irritable?  ?Rating(9) ? ?

## 2021-05-21 ENCOUNTER — Other Ambulatory Visit: Payer: Self-pay

## 2021-05-21 ENCOUNTER — Ambulatory Visit
Admission: RE | Admit: 2021-05-21 | Discharge: 2021-05-21 | Disposition: A | Discharge status: Home / Self Care | Payer: 59 | Source: Ambulatory Visit | Attending: Physical Medicine and Rehabilitation | Admitting: Physical Medicine and Rehabilitation

## 2021-05-22 ENCOUNTER — Other Ambulatory Visit: Payer: Self-pay | Admitting: Physical Medicine and Rehabilitation

## 2021-05-23 ENCOUNTER — Other Ambulatory Visit: Payer: Self-pay | Admitting: Physical Medicine and Rehabilitation

## 2021-05-23 DIAGNOSIS — M5416 Radiculopathy, lumbar region: Secondary | ICD-10-CM

## 2021-05-23 NOTE — Progress Notes (Signed)
Called patient this morning, did review recent lumbar MRI findings with him, no significant changes from 2020. Patient states he continues to have pain to right lower back radiating down posterior thighs to knees. I did discuss performing lumbar epidural steroid injection and patient is agreeable with plan. I placed order for right L3-L4 interlaminar epidural steroid injection. Not currently taking anticoagulant medication. Will call patient to schedule. He has no questions at this time.  ?

## 2021-05-29 ENCOUNTER — Telehealth: Payer: Self-pay | Admitting: Physical Medicine and Rehabilitation

## 2021-05-29 NOTE — Telephone Encounter (Signed)
Patient's daughter would like a call to schedule for MRI review. Her call back number is [phone] ?

## 2021-06-28 ENCOUNTER — Telehealth: Payer: Self-pay | Admitting: Physical Medicine and Rehabilitation

## 2021-06-28 DIAGNOSIS — F411 Generalized anxiety disorder: Secondary | ICD-10-CM

## 2021-06-28 MED ORDER — DIAZEPAM 5 MG PO TABS: ORAL_TABLET | ORAL | 0 refills | Status: DC

## 2021-06-28 NOTE — Telephone Encounter (Signed)
Pt is calling to get something to relax him before the procedure  ?

## 2021-06-28 NOTE — Telephone Encounter (Signed)
Pre-procedure diazepam ordered for pre-operative anxiety.  

## 2021-07-02 ENCOUNTER — Ambulatory Visit: Payer: 59 | Admitting: Physical Medicine and Rehabilitation

## 2021-07-02 ENCOUNTER — Ambulatory Visit: Payer: Self-pay

## 2021-07-02 ENCOUNTER — Encounter: Payer: Self-pay | Admitting: Physical Medicine and Rehabilitation

## 2021-07-02 VITALS — BP 136/79 | HR 83

## 2021-07-02 DIAGNOSIS — M5416 Radiculopathy, lumbar region: Secondary | ICD-10-CM

## 2021-07-02 MED ORDER — METHYLPREDNISOLONE ACETATE 80 MG/ML IJ SUSP
80.0000 mg | Freq: Once | INTRAMUSCULAR | Status: AC
  Administered 2021-07-02: 80 mg

## 2021-07-02 NOTE — Patient Instructions (Signed)

## 2021-07-02 NOTE — Progress Notes (Signed)
Numeric Pain Rating Scale and Functional Assessment Average Pain 6   In the last MONTH (on 0-10 scale) has pain interfered with the following?  1. General activity like being  able to carry out your everyday physical activities such as walking, climbing stairs, carrying groceries, or moving a chair?  Rating(6)  Patient is here for an injection. 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 Takes Tylenol as needed for pain.

## 2021-07-22 NOTE — Procedures (Signed)
Lumbar Epidural Steroid Injection - Interlaminar Approach with Fluoroscopic Guidance  Patient: [name] [name]      Date of Birth: 02-May-1950 MRN: [id number] PCP: Majel Homer, PA      Visit Date: 07/02/2021   Universal Protocol:     Consent Given By: the patient  Position: PRONE  Additional Comments: Vital signs were monitored before and after the procedure. Patient was prepped and draped in the usual sterile fashion. The correct patient, procedure, and site was verified.   Injection Procedure Details:   Procedure diagnoses: Lumbar radiculopathy [M54.16]   Meds Administered:  Meds ordered this encounter  Medications   methylPREDNISolone acetate (DEPO-MEDROL) injection 80 mg     Laterality: Right  Location/Site:  L3-4  Needle: 3.5 in., 20 ga. Tuohy  Needle Placement: Paramedian epidural  Findings:   -Comments: Excellent flow of contrast into the epidural space.  Procedure Details: Using a paramedian approach from the side mentioned above, the region overlying the inferior lamina was localized under fluoroscopic visualization and the soft tissues overlying this structure were infiltrated with 4 ml. of 1% Lidocaine without Epinephrine. The Tuohy needle was inserted into the epidural space using a paramedian approach.   The epidural space was localized using loss of resistance along with counter oblique bi-planar fluoroscopic views.  After negative aspirate for air, blood, and CSF, a 2 ml. volume of Isovue-250 was injected into the epidural space and the flow of contrast was observed. Radiographs were obtained for documentation purposes.    The injectate was administered into the level noted above.   Additional Comments:  The patient tolerated the procedure well Dressing: 2 x 2 sterile gauze and Band-Aid    Post-procedure details: Patient was observed during the procedure. Post-procedure instructions were reviewed.  Patient left the clinic in stable condition.

## 2021-07-22 NOTE — Progress Notes (Signed)
[name] [name] - 71 y.o. male MRN [id number]  Date of birth: 1950-06-04  Office Visit Note: Visit Date: 07/02/2021 PCP: Majel Homer, PA Referred by: Majel Homer, PA  Subjective: Chief Complaint  Patient presents with   Lower Back - Pain   HPI:  [name] [name] is a 71 y.o. male who comes in today at the request of Barnet Pall, FNP for planned Right L3-4 Lumbar Interlaminar epidural steroid injection with fluoroscopic guidance.  The patient has failed conservative care including home exercise, medications, time and activity modification.  This injection will be diagnostic and hopefully therapeutic.  Please see requesting physician notes for further details and justification.  ROS Otherwise per HPI.  Assessment & Plan: Visit Diagnoses:    ICD-10-CM   1. Lumbar radiculopathy  M54.16 XR C-ARM NO REPORT    Epidural Steroid injection    methylPREDNISolone acetate (DEPO-MEDROL) injection 80 mg      Plan: No additional findings.   Meds & Orders:  Meds ordered this encounter  Medications   methylPREDNISolone acetate (DEPO-MEDROL) injection 80 mg    Orders Placed This Encounter  Procedures   XR C-ARM NO REPORT   Epidural Steroid injection    Follow-up: Return if symptoms worsen or fail to improve.   Procedures: No procedures performed  Lumbar Epidural Steroid Injection - Interlaminar Approach with Fluoroscopic Guidance  Patient: [name] [name]      Date of Birth: 12-08-1950 MRN: [id number] PCP: Majel Homer, PA      Visit Date: 07/02/2021   Universal Protocol:     Consent Given By: the patient  Position: PRONE  Additional Comments: Vital signs were monitored before and after the procedure. Patient was prepped and draped in the usual sterile fashion. The correct patient, procedure, and site was verified.   Injection Procedure Details:   Procedure diagnoses: Lumbar radiculopathy [M54.16]   Meds Administered:  Meds ordered this encounter  Medications    methylPREDNISolone acetate (DEPO-MEDROL) injection 80 mg     Laterality: Right  Location/Site:  L3-4  Needle: 3.5 in., 20 ga. Tuohy  Needle Placement: Paramedian epidural  Findings:   -Comments: Excellent flow of contrast into the epidural space.  Procedure Details: Using a paramedian approach from the side mentioned above, the region overlying the inferior lamina was localized under fluoroscopic visualization and the soft tissues overlying this structure were infiltrated with 4 ml. of 1% Lidocaine without Epinephrine. The Tuohy needle was inserted into the epidural space using a paramedian approach.   The epidural space was localized using loss of resistance along with counter oblique bi-planar fluoroscopic views.  After negative aspirate for air, blood, and CSF, a 2 ml. volume of Isovue-250 was injected into the epidural space and the flow of contrast was observed. Radiographs were obtained for documentation purposes.    The injectate was administered into the level noted above.   Additional Comments:  The patient tolerated the procedure well Dressing: 2 x 2 sterile gauze and Band-Aid    Post-procedure details: Patient was observed during the procedure. Post-procedure instructions were reviewed.  Patient left the clinic in stable condition.   Clinical History: MRI LUMBAR SPINE WITHOUT CONTRAST   TECHNIQUE: Multiplanar, multisequence MR imaging of the lumbar spine was performed. No intravenous contrast was administered.   COMPARISON:  MRI 07/13/2018.   FINDINGS: Segmentation:  5 lumbar vertebra   Alignment:  Normal   Vertebrae: No fracture or mass lesion. Degenerative endplate changes at K9-3, L4-5, L5-S1. Bone marrow is hypointense on  T1 diffusely, similar to the prior study.   Conus medullaris and cauda equina: Conus extends to the L1-2 level. Conus and cauda equina appear normal.   Paraspinal and other soft tissues: Negative for paraspinous  mass, adenopathy, or fluid collection   Disc levels:   L1-2: Negative   L2-3: Disc degeneration with diffuse disc bulging. Broad-based disc protrusion has progressed in the interval. Moderate to advanced facet degeneration bilaterally. Moderate spinal stenosis with mild progression. Moderate subarticular stenosis bilaterally.   L3-4: Disc degeneration with diffuse bulging of the disc and bilateral facet degeneration. Mild spinal stenosis. Mild to moderate subarticular stenosis bilaterally. No interval change   L4-5: Disc degeneration with disc space narrowing and mild endplate degenerative changes. Bilateral facet hypertrophy right greater than left. Mild subarticular stenosis bilaterally right greater than left. Spinal canal adequate in size   L5-S1: Moderate disc degeneration with disc space narrowing and diffuse endplate spurring. Moderate facet degeneration bilaterally. Moderate subarticular and foraminal stenosis bilaterally. No interval change.   IMPRESSION: Multilevel lumbar spondylosis.   Progressive disc protrusion at L2-3 with progressive moderate spinal stenosis. Other levels are stable from 2020 MRI.     Electronically Signed   By: Franchot Gallo M.D.   On: 05/22/2021 11:42     Objective:  VS:  HT:    WT:   BMI:     BP:136/79  HR:83bpm  TEMP: ( )  RESP:  Physical Exam Vitals and nursing note reviewed.  Constitutional:      General: He is not in acute distress.    Appearance: Normal appearance. He is not ill-appearing.  HENT:     Head: Normocephalic and atraumatic.     Right Ear: External ear normal.     Left Ear: External ear normal.     Nose: No congestion.  Eyes:     Extraocular Movements: Extraocular movements intact.  Cardiovascular:     Rate and Rhythm: Normal rate.     Pulses: Normal pulses.  Pulmonary:     Effort: Pulmonary effort is normal. No respiratory distress.  Abdominal:     General: There is no distension.     Palpations:  Abdomen is soft.  Musculoskeletal:        General: No tenderness or signs of injury.     Cervical back: Neck supple.     Right lower leg: No edema.     Left lower leg: No edema.     Comments: Patient has good distal strength without clonus.  Skin:    Findings: No erythema or rash.  Neurological:     General: No focal deficit present.     Mental Status: He is alert and oriented to person, place, and time.     Sensory: No sensory deficit.     Motor: No weakness or abnormal muscle tone.     Coordination: Coordination normal.  Psychiatric:        Mood and Affect: Mood normal.        Behavior: Behavior normal.     Imaging: No results found.

## 2021-08-10 ENCOUNTER — Telehealth: Payer: Self-pay | Admitting: Physical Medicine and Rehabilitation

## 2021-08-10 NOTE — Telephone Encounter (Signed)
Pt called requesting a call back for injection. Please call pt at [phone].

## 2021-08-20 ENCOUNTER — Ambulatory Visit: Payer: 59 | Admitting: Physical Medicine and Rehabilitation

## 2021-08-29 ENCOUNTER — Encounter: Payer: Self-pay | Admitting: Physical Medicine and Rehabilitation

## 2021-08-29 ENCOUNTER — Ambulatory Visit: Payer: 59 | Admitting: Physical Medicine and Rehabilitation

## 2021-08-29 DIAGNOSIS — M4726 Other spondylosis with radiculopathy, lumbar region: Secondary | ICD-10-CM | POA: Diagnosis not present

## 2021-08-29 DIAGNOSIS — M47816 Spondylosis without myelopathy or radiculopathy, lumbar region: Secondary | ICD-10-CM | POA: Diagnosis not present

## 2021-08-29 DIAGNOSIS — M48062 Spinal stenosis, lumbar region with neurogenic claudication: Secondary | ICD-10-CM | POA: Diagnosis not present

## 2021-08-29 DIAGNOSIS — M5416 Radiculopathy, lumbar region: Secondary | ICD-10-CM | POA: Diagnosis not present

## 2021-08-29 MED ORDER — DIAZEPAM 5 MG PO TABS: ORAL_TABLET | ORAL | 0 refills | Status: DC

## 2021-08-29 NOTE — Progress Notes (Signed)
[name] [name] - 71 y.o. male MRN [id number]  Date of birth: [birth year]  Office Visit Note: Visit Date: 08/29/2021 PCP: Majel Homer, PA Referred by: Majel Homer, PA  Subjective: No chief complaint on file.  HPI: [name] [name] is a 71 y.o. male who comes in today for evaluation of chronic, worsening and severe bilateral lower back pain with referral to posterior hips and thighs down to knees, right greater than left. Patient also reports numbness/tingling to lateral thighs. His symptoms have been ongoing for several years and are exacerbated by prolonged standing, walking and activity. Patient states he does need to take frequent breaks when standing/walking for long periods of time. He describes his pain as sore and aching, currently rates as 7 out of 10. Patient reports some relief of pain with home exercise regimen, rest and use of over the counter medications. Patient has attended formal chiropractic treatments in the past with Dr. Marlou Starks in [place], [place] and reports no relief of pain with these therapies. Patients lumbar MRI from March of this year exhibits multilevel facet hypertrophy and progressive moderate spinal canal stenosis at L2-L3. 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 Patient states he does drive 7-8 hours each night, round trip from [place] to [place]. Patient first seen in our office in 2021 and states initial interventional spine procedures were significantly helpful in reducing his pain, however recent right L3-L4 interlaminar epidural steroid injection on 07/02/2021 provided greater than 50% pain relief for only 2-3 weeks. Patient does report increased functionality post injection. Patient was previously treated by Dr. Neomia Dear and also reports good relief with initial injection procedures. Per Dr. Anselmo Rod notes patient underwent multiple lumbar epidural steroid injections and  did complete right L2-L5 radiofrequency ablation procedure.   Patients course is complicated by morbid obesity, diabetes mellitus and chronic kidney diease.    Review of Systems  Musculoskeletal:  Positive for back pain.  Neurological:  Positive for tingling. Negative for focal weakness and weakness.  All other systems reviewed and are negative.  Otherwise per HPI.  Assessment & Plan: Visit Diagnoses:    ICD-10-CM   1. Lumbar radiculopathy  M54.16     2. Spinal stenosis of lumbar region with neurogenic claudication  M48.062     3. Other spondylosis with radiculopathy, lumbar region  M47.26     4. Facet hypertrophy of lumbar region  M47.816        Plan: Findings:  Chronic, worsening and severe bilateral lower back pain with referral to posterior hips and thighs down to knees, right greater than left. Also, numbness/tingling to lateral thighs. Patient continues to have severe pain despite good conservative therapies such as home exercise regimen, rest and use of over the counter medications. Patients clinical presentation and exam are consistent with neurogenic claudication as a result of spinal canal stenosis. There is progressive moderate spinal canal stenosis at the level of L2-L3. Patient did get greater than 50% relief with recent right L3-L4 interlaminar epidural steroid injection for 2-3 weeks. We believe the next step is to repeat right L3-L4 interlaminar epidural steroid injection under fluoroscopic guidance. If patient gets significant and sustained relief of pain with injection we can repeat these infrequently as needed. If his pain persists post injection I do feel it would be beneficial for patient to consult with neurosurgeon to discuss treatment options. Patient could be a good candidate for a 1 level decompression. Patient would like to  repeat injection before consulting with surgeon, I will place referral today and will call patient to schedule when injection is approved.  Patient has no questions at this time. He did voice concerns about anxiety related to injection procedure, I will place order for pre-procedure Valium today. No red flag symptoms noted upon exam today.     Meds & Orders: No orders of the defined types were placed in this encounter.  No orders of the defined types were placed in this encounter.   Follow-up: Return for Right L3-L4 interlaminar epidural steroid injection.   Procedures: No procedures performed      Clinical History: MRI LUMBAR SPINE WITHOUT CONTRAST   TECHNIQUE: Multiplanar, multisequence MR imaging of the lumbar spine was performed. No intravenous contrast was administered.   COMPARISON:  MRI 07/13/2018.   FINDINGS: Segmentation:  5 lumbar vertebra   Alignment:  Normal   Vertebrae: No fracture or mass lesion. Degenerative endplate changes at M0-1, L4-5, L5-S1. Bone marrow is hypointense on T1 diffusely, similar to the prior study.   Conus medullaris and cauda equina: Conus extends to the L1-2 level. Conus and cauda equina appear normal.   Paraspinal and other soft tissues: Negative for paraspinous mass, adenopathy, or fluid collection   Disc levels:   L1-2: Negative   L2-3: Disc degeneration with diffuse disc bulging. Broad-based disc protrusion has progressed in the interval. Moderate to advanced facet degeneration bilaterally. Moderate spinal stenosis with mild progression. Moderate subarticular stenosis bilaterally.   L3-4: Disc degeneration with diffuse bulging of the disc and bilateral facet degeneration. Mild spinal stenosis. Mild to moderate subarticular stenosis bilaterally. No interval change   L4-5: Disc degeneration with disc space narrowing and mild endplate degenerative changes. Bilateral facet hypertrophy right greater than left. Mild subarticular stenosis bilaterally right greater than left. Spinal canal adequate in size   L5-S1: Moderate disc degeneration with disc space narrowing  and diffuse endplate spurring. Moderate facet degeneration bilaterally. Moderate subarticular and foraminal stenosis bilaterally. No interval change.   IMPRESSION: Multilevel lumbar spondylosis.   Progressive disc protrusion at L2-3 with progressive moderate spinal stenosis. Other levels are stable from 2020 MRI.     Electronically Signed   By: Franchot Gallo M.D.   On: 05/22/2021 11:42   He reports that he quit smoking about 48 years ago. His smoking use included cigarettes. He has never used smokeless tobacco.  Recent Labs    09/04/20 1058  LABURIC 5.8    Objective:  VS:  HT:    WT:   BMI:     BP:   HR: bpm  TEMP: ( )  RESP:  Physical Exam Vitals and nursing note reviewed.  HENT:     Head: Normocephalic and atraumatic.     Right Ear: External ear normal.     Left Ear: External ear normal.     Nose: Nose normal.     Mouth/Throat:     Mouth: Mucous membranes are moist.  Eyes:     Extraocular Movements: Extraocular movements intact.  Cardiovascular:     Rate and Rhythm: Normal rate.     Pulses: Normal pulses.  Pulmonary:     Effort: Pulmonary effort is normal.  Abdominal:     General: Abdomen is flat. There is no distension.  Musculoskeletal:        General: Tenderness present.     Cervical back: Normal range of motion.     Comments: Pt is slow to rise from seated position to standing. Good lumbar range  of motion. Strong distal strength without clonus, no pain upon palpation of greater trochanters. Sensation intact bilaterally. Walks independently, gait steady.   Skin:    General: Skin is warm and dry.     Capillary Refill: Capillary refill takes less than 2 seconds.  Neurological:     General: No focal deficit present.     Mental Status: He is alert and oriented to person, place, and time.  Psychiatric:        Mood and Affect: Mood normal.        Behavior: Behavior normal.     Ortho Exam  Imaging: No results found.  Past  Medical/Family/Surgical/Social History: Medications & Allergies reviewed per EMR, new medications updated. Patient Active Problem List   Diagnosis Date Noted   Chronic low back pain with bilateral sciatica 09/04/2020   Joint stiffness 09/04/2020   Diaphragmatic hernia 11/22/2019   Osteoarthritis 11/22/2019   Type II or unspecified type diabetes mellitus without mention of complication, uncontrolled 03/18/2017   Mild nonproliferative diabetic retinopathy ([facility]) 03/18/2017   Nuclear sclerosis 03/18/2017   ED (erectile dysfunction) of organic origin 10/06/2016   Elevated prostate specific antigen (PSA) 10/06/2016   Male hypogonadism 10/06/2016   Stage 4 chronic kidney disease ([facility]) 10/06/2016   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   IgA monoclonal gammopathy of uncertain significance 06/03/2016   CAD (coronary artery disease) 08/17/2010   Mixed hyperlipidemia 08/17/2010   Hypertension 08/17/2010   OSA (obstructive sleep apnea) 08/17/2010   Morbid obesity ([facility]) 08/17/2010   Esophageal reflux 06/19/2001   Hypersomnia with sleep apnea 04/30/2000   Hypertrophy of prostate without urinary obstruction and other lower urinary tract symptoms (LUTS) 04/30/1999   Proteinuria 04/30/1999   Past Medical History:  Diagnosis Date   Coronary artery disease    Nonobstructive per cath in 2005 and 2006 with 50% LAD stenosis, rechecked with IVUS  during cath in 2006.   Diabetes mellitus    Hyperlipidemia    Hypertension    Obesity, morbid ([facility])    OSA (obstructive sleep apnea)    Uses CPAP   Stage 4 chronic kidney disease ([facility])    Per patient   Family History  Problem Relation Age of Onset   Heart failure Mother    Hypertension Mother    Diabetes Mother    Arthritis Mother    Stroke Father    Hypertension Father    Diabetes Sister    Hypertension Sister    Obesity Sister    Obesity Brother    Diabetes Brother    Hypertension Brother    Healthy Daughter    Past Surgical  History:  Procedure Laterality Date   CHOLECYSTECTOMY     NASAL SINUS SURGERY     Social History   Occupational History   Not on file  Tobacco Use   Smoking status: Former    Types: Cigarettes    Quit date: 1975    Years since quitting: 48.5   Smokeless tobacco: Never  Vaping Use   Vaping Use: Never used  Substance and Sexual Activity   Alcohol use: Yes    Comment: Very rarely   Drug use: No   Sexual activity: Yes

## 2021-10-01 ENCOUNTER — Ambulatory Visit: Payer: 59 | Admitting: Physical Medicine and Rehabilitation

## 2021-10-01 ENCOUNTER — Ambulatory Visit: Payer: Self-pay

## 2021-10-01 ENCOUNTER — Encounter: Payer: Self-pay | Admitting: Physical Medicine and Rehabilitation

## 2021-10-01 VITALS — BP 167/65 | HR 80

## 2021-10-01 DIAGNOSIS — M5416 Radiculopathy, lumbar region: Secondary | ICD-10-CM

## 2021-10-01 MED ORDER — METHYLPREDNISOLONE ACETATE 80 MG/ML IJ SUSP
80.0000 mg | Freq: Once | INTRAMUSCULAR | Status: AC
  Administered 2021-10-01: 80 mg

## 2021-10-01 NOTE — Progress Notes (Signed)
Pt state lower back pain that travels down both hips mostly on the right side. Pt state walking and standing makes the pain worse. Pt state he takes over the counter pain meds.  Numeric Pain Rating Scale and Functional Assessment Average Pain 7   In the last MONTH (on 0-10 scale) has pain interfered with the following?  1. General activity like being  able to carry out your everyday physical activities such as walking, climbing stairs, carrying groceries, or moving a chair?  Rating(10)   +Driver, -BT, -Dye Allergies.

## 2021-10-01 NOTE — Patient Instructions (Signed)

## 2021-10-10 NOTE — Progress Notes (Signed)
[name] - 71 y.o. male MRN [id number]  Date of birth: 11-Aug-1950  Office Visit Note: Visit Date: 10/01/2021 PCP: [name] Homer, PA Referred by: [name] Homer, PA  Subjective: Chief Complaint  Patient presents with   Lower Back - Pain   Right Hip - Pain   Left Hip - Pain   HPI:  [name] [name] is a 71 y.o. male who comes in today at the request of Barnet Pall, FNP for planned Right L3-4 Lumbar Interlaminar epidural steroid injection with fluoroscopic guidance.  The patient has failed conservative care including home exercise, medications, time and activity modification.  This injection will be diagnostic and hopefully therapeutic.  Please see requesting physician notes for further details and justification.   ROS Otherwise per HPI.  Assessment & Plan: Visit Diagnoses:    ICD-10-CM   1. Lumbar radiculopathy  M54.16 XR C-ARM NO REPORT    Epidural Steroid injection    methylPREDNISolone acetate (DEPO-MEDROL) injection 80 mg      Plan: No additional findings.   Meds & Orders:  Meds ordered this encounter  Medications   methylPREDNISolone acetate (DEPO-MEDROL) injection 80 mg    Orders Placed This Encounter  Procedures   XR C-ARM NO REPORT   Epidural Steroid injection    Follow-up: Return for visit to requesting provider as needed.   Procedures: No procedures performed  Lumbar Epidural Steroid Injection - Interlaminar Approach with Fluoroscopic Guidance  Patient: [name] [name]      Date of Birth: Jul 19, 1950 MRN: [id number] PCP: [name] Homer, PA      Visit Date: 10/01/2021   Universal Protocol:     Consent Given By: the patient  Position: PRONE  Additional Comments: Vital signs were monitored before and after the procedure. Patient was prepped and draped in the usual sterile fashion. The correct patient, procedure, and site was verified.   Injection Procedure Details:   Procedure diagnoses: Lumbar radiculopathy [M54.16]   Meds  Administered:  Meds ordered this encounter  Medications   methylPREDNISolone acetate (DEPO-MEDROL) injection 80 mg     Laterality: Right  Location/Site:  L3-4  Needle: 3.5 in., 20 ga. Tuohy  Needle Placement: Paramedian epidural  Findings:   -Comments: Excellent flow of contrast into the epidural space.  Procedure Details: Using a paramedian approach from the side mentioned above, the region overlying the inferior lamina was localized under fluoroscopic visualization and the soft tissues overlying this structure were infiltrated with 4 ml. of 1% Lidocaine without Epinephrine. The Tuohy needle was inserted into the epidural space using a paramedian approach.   The epidural space was localized using loss of resistance along with counter oblique bi-planar fluoroscopic views.  After negative aspirate for air, blood, and CSF, a 2 ml. volume of Isovue-250 was injected into the epidural space and the flow of contrast was observed. Radiographs were obtained for documentation purposes.    The injectate was administered into the level noted above.   Additional Comments:  No complications occurred Dressing: 2 x 2 sterile gauze and Band-Aid    Post-procedure details: Patient was observed during the procedure. Post-procedure instructions were reviewed.  Patient left the clinic in stable condition.   Clinical History: MRI LUMBAR SPINE WITHOUT CONTRAST   TECHNIQUE: Multiplanar, multisequence MR imaging of the lumbar spine was performed. No intravenous contrast was administered.   COMPARISON:  MRI 07/13/2018.   FINDINGS: Segmentation:  5 lumbar vertebra   Alignment:  Normal   Vertebrae: No fracture or mass lesion. Degenerative endplate  changes at L3-4, L4-5, L5-S1. Bone marrow is hypointense on T1 diffusely, similar to the prior study.   Conus medullaris and cauda equina: Conus extends to the L1-2 level. Conus and cauda equina appear normal.   Paraspinal and other soft  tissues: Negative for paraspinous mass, adenopathy, or fluid collection   Disc levels:   L1-2: Negative   L2-3: Disc degeneration with diffuse disc bulging. Broad-based disc protrusion has progressed in the interval. Moderate to advanced facet degeneration bilaterally. Moderate spinal stenosis with mild progression. Moderate subarticular stenosis bilaterally.   L3-4: Disc degeneration with diffuse bulging of the disc and bilateral facet degeneration. Mild spinal stenosis. Mild to moderate subarticular stenosis bilaterally. No interval change   L4-5: Disc degeneration with disc space narrowing and mild endplate degenerative changes. Bilateral facet hypertrophy right greater than left. Mild subarticular stenosis bilaterally right greater than left. Spinal canal adequate in size   L5-S1: Moderate disc degeneration with disc space narrowing and diffuse endplate spurring. Moderate facet degeneration bilaterally. Moderate subarticular and foraminal stenosis bilaterally. No interval change.   IMPRESSION: Multilevel lumbar spondylosis.   Progressive disc protrusion at L2-3 with progressive moderate spinal stenosis. Other levels are stable from 2020 MRI.     Electronically Signed   By: Franchot Gallo M.D.   On: 05/22/2021 11:42     Objective:  VS:  HT:    WT:   BMI:     BP:(!) 167/65  HR:80bpm  TEMP: ( )  RESP:  Physical Exam Vitals and nursing note reviewed.  Constitutional:      General: He is not in acute distress.    Appearance: Normal appearance. He is not ill-appearing.  HENT:     Head: Normocephalic and atraumatic.     Right Ear: External ear normal.     Left Ear: External ear normal.     Nose: No congestion.  Eyes:     Extraocular Movements: Extraocular movements intact.  Cardiovascular:     Rate and Rhythm: Normal rate.     Pulses: Normal pulses.  Pulmonary:     Effort: Pulmonary effort is normal. No respiratory distress.  Abdominal:     General: There  is no distension.     Palpations: Abdomen is soft.  Musculoskeletal:        General: No tenderness or signs of injury.     Cervical back: Neck supple.     Right lower leg: No edema.     Left lower leg: No edema.     Comments: Patient has good distal strength without clonus.  Skin:    Findings: No erythema or rash.  Neurological:     General: No focal deficit present.     Mental Status: He is alert and oriented to person, place, and time.     Sensory: No sensory deficit.     Motor: No weakness or abnormal muscle tone.     Coordination: Coordination normal.  Psychiatric:        Mood and Affect: Mood normal.        Behavior: Behavior normal.      Imaging: No results found.

## 2021-10-10 NOTE — Procedures (Signed)
Lumbar Epidural Steroid Injection - Interlaminar Approach with Fluoroscopic Guidance  Patient: [name] [name]      Date of Birth: 02-11-1951 MRN: [id number] PCP: Majel Homer, PA      Visit Date: 10/01/2021   Universal Protocol:     Consent Given By: the patient  Position: PRONE  Additional Comments: Vital signs were monitored before and after the procedure. Patient was prepped and draped in the usual sterile fashion. The correct patient, procedure, and site was verified.   Injection Procedure Details:   Procedure diagnoses: Lumbar radiculopathy [M54.16]   Meds Administered:  Meds ordered this encounter  Medications   methylPREDNISolone acetate (DEPO-MEDROL) injection 80 mg     Laterality: Right  Location/Site:  L3-4  Needle: 3.5 in., 20 ga. Tuohy  Needle Placement: Paramedian epidural  Findings:   -Comments: Excellent flow of contrast into the epidural space.  Procedure Details: Using a paramedian approach from the side mentioned above, the region overlying the inferior lamina was localized under fluoroscopic visualization and the soft tissues overlying this structure were infiltrated with 4 ml. of 1% Lidocaine without Epinephrine. The Tuohy needle was inserted into the epidural space using a paramedian approach.   The epidural space was localized using loss of resistance along with counter oblique bi-planar fluoroscopic views.  After negative aspirate for air, blood, and CSF, a 2 ml. volume of Isovue-250 was injected into the epidural space and the flow of contrast was observed. Radiographs were obtained for documentation purposes.    The injectate was administered into the level noted above.   Additional Comments:  No complications occurred Dressing: 2 x 2 sterile gauze and Band-Aid    Post-procedure details: Patient was observed during the procedure. Post-procedure instructions were reviewed.  Patient left the clinic in stable condition.

## 2021-12-19 ENCOUNTER — Other Ambulatory Visit: Payer: Self-pay | Admitting: Physical Medicine and Rehabilitation

## 2021-12-19 ENCOUNTER — Telehealth: Payer: Self-pay | Admitting: Physical Medicine and Rehabilitation

## 2021-12-19 DIAGNOSIS — M48062 Spinal stenosis, lumbar region with neurogenic claudication: Secondary | ICD-10-CM

## 2021-12-19 DIAGNOSIS — M5416 Radiculopathy, lumbar region: Secondary | ICD-10-CM

## 2021-12-19 NOTE — Telephone Encounter (Addendum)
Patient called advised he is not any better. Patient said he has gotten worse. Patient asked can he be referred to a surgeon to have surgery. Patient asked if he can get a call back during any morning. The number to contact patient is [phone] or [phone] Kirstin-daughter

## 2021-12-19 NOTE — Telephone Encounter (Signed)
IC advised referral placed and someone would be calling to schedule.

## 2021-12-24 ENCOUNTER — Ambulatory Visit (INDEPENDENT_AMBULATORY_CARE_PROVIDER_SITE_OTHER): Payer: 59 | Admitting: Orthopedic Surgery

## 2021-12-24 ENCOUNTER — Ambulatory Visit (INDEPENDENT_AMBULATORY_CARE_PROVIDER_SITE_OTHER): Payer: 59

## 2021-12-24 ENCOUNTER — Encounter: Payer: Self-pay | Admitting: Orthopedic Surgery

## 2021-12-24 VITALS — BP 162/81 | HR 75 | Ht 66.5 in | Wt 302.6 lb

## 2021-12-24 DIAGNOSIS — M5416 Radiculopathy, lumbar region: Secondary | ICD-10-CM | POA: Diagnosis not present

## 2021-12-24 DIAGNOSIS — M4726 Other spondylosis with radiculopathy, lumbar region: Secondary | ICD-10-CM | POA: Diagnosis not present

## 2021-12-24 DIAGNOSIS — M48062 Spinal stenosis, lumbar region with neurogenic claudication: Secondary | ICD-10-CM | POA: Diagnosis not present

## 2021-12-24 MED ORDER — LIDOCAINE 5 % EX PTCH: 1.0000 | MEDICATED_PATCH | CUTANEOUS | 1 refills | Status: DC

## 2021-12-24 NOTE — Progress Notes (Addendum)
Orthopedic Spine Surgery Office Note  Assessment: Patient is a 71 y.o. male with low back pain and bilateral leg pain.  Has loss of lumbar lordosis with PI-LL mismatch.  He also has significant stenosis at L2-3 causing neurogenic claudication.   Plan: -Explained that initially conservative treatment is tried as a significant number of patients may experience relief with these treatment modalities. Discussed that the conservative treatments include:  -activity modification  -physical therapy  -over the counter pain medications  -lumbar steroid injections -Lidocaine patches were prescribed to him today along with a referral to physical therapy to try further conservative treatment while he is losing weight -We discussed weight loss to help with his pain but also to decrease some of the risks associated with surgery should he elect to proceed with operative management. I gave him a weight loss goal 250lbs -He has failed conservative treatment so we talked about anterior interbody fusion to correct his alignment and posterior decompression with fusion should be able to get his weight down to 250lbs.  -Patient should return to office once he is down 250lbs so we can discuss surgery again. XR at next visit: scoli   Patient expressed understanding of the plan and all questions were answered to the patient's satisfaction.   ___________________________________________________________________________   History:  Patient is a 71 y.o. male who presents today for lumbar spine.  Patient has had 3 years of low back and bilateral leg pain.  He states that happened after picking up his mother-in-law after she fell to the floor.  Pain is felt in his low back and radiates into his bilateral legs.  Radiates into the posterior aspect of the legs.  Feels like his legs are cramping. He feels the leg pain mostly when walking and it completely resolves when he sits down.  His back pain is more  constant.   Weakness: Denies Symptoms of imbalance: Denies Paresthesias and numbness: Yes, down the posterior aspect of the right leg.  No other numbness or paresthesias Bowel or bladder incontinence: Denies Saddle anesthesia: Denies  Treatments tried: Tylenol, steroid injections, activity modification  Review of systems: Denies fevers and chills, night sweats, unexplained weight loss, history of cancer.  Has had pain that wakes him at night  Past medical history: Hypertension Chronic kidney disease Diabetes (last A1c 6.6) Sleep apnea Chronic pain  Allergies: Olmesartan  Past surgical history:  Cholecystectomy   Social history: Denies use of nicotine product (smoking, vaping, patches, smokeless) Alcohol use: Yes, 3/week Denies recreational drug use   Physical Exam:  General: no acute distress, appears stated age Neurologic: alert, answering questions appropriately, following commands Respiratory: unlabored breathing on room air, symmetric chest rise Psychiatric: appropriate affect, normal cadence to speech   MSK (spine):  -Strength exam      Left  Right EHL    4/5  4/5 TA    5/5  5/5 GSC    5/5  5/5 Knee extension  5/5  5/5 Hip flexion   5/5  5/5  -Sensory exam    Sensation intact to light touch in L3-S1 nerve distributions of bilateral lower extremities  -Achilles DTR: 1/4 on the left, 1/4 on the right -Patellar tendon DTR: 1/4 on the left, 1/4 on the right  -Straight leg raise: Negative -Contralateral straight leg raise: Negative -Clonus: no beats bilaterally  -Left hip exam: No pain through range of motion, negative Stinchfield -Right hip exam: No pain to range of motion, negative Stinchfield  Imaging: XR of the lumbar spine  from 12/24/2021 was independently reviewed and interpreted, showing multi-level disc height loss with relative loss of lumbar lordosis. No evidence of instability. No fracture or dislocation. PI of 62. LL of 33.  MRI of the  lumbar spine from 05/21/2021 was independently reviewed and interpreted, showing DDD at L2/3, L3/4, L4/5, and L5/S1. Central and lateral recess stenosis at L2/3. Foraminal stenosis at L4/5 and L5/S1.    Patient name: [name] [name] Patient MRN: [id number] Date of visit: 12/24/21

## 2022-01-01 NOTE — Therapy (Unsigned)
OUTPATIENT PHYSICAL THERAPY THORACOLUMBAR EVALUATION   Patient Name: [name] [name] MRN: [id number] DOB:May 14, 1950, 71 y.o., male Today's Date: 01/02/2022   PT End of Session - 01/02/22 1009     Visit Number 1    Date for PT Re-Evaluation 02/27/22    Authorization Type UHC    Authorization Time Period 01/02/22 to 02/27/22    Progress Note Due on Visit 10    PT Start Time 0929    PT Stop Time 1008    PT Time Calculation (min) 39 min    Activity Tolerance Patient tolerated treatment well    Behavior During Therapy [facility] for tasks assessed/performed             Past Medical History:  Diagnosis Date   Coronary artery disease    Nonobstructive per cath in 2005 and 2006 with 50% LAD stenosis, rechecked with IVUS  during cath in 2006.   Diabetes mellitus    Hyperlipidemia    Hypertension    Obesity, morbid (HCC)    OSA (obstructive sleep apnea)    Uses CPAP   Stage 4 chronic kidney disease ([place])    Per patient   Past Surgical History:  Procedure Laterality Date   CHOLECYSTECTOMY     NASAL SINUS SURGERY     Patient Active Problem List   Diagnosis Date Noted   Chronic low back pain with bilateral sciatica 09/04/2020   Joint stiffness 09/04/2020   Diaphragmatic hernia 11/22/2019   Osteoarthritis 11/22/2019   Type II or unspecified type diabetes mellitus without mention of complication, uncontrolled 03/18/2017   Mild nonproliferative diabetic retinopathy ([place]) 03/18/2017   Nuclear sclerosis 03/18/2017   ED (erectile dysfunction) of organic origin 10/06/2016   Elevated prostate specific antigen (PSA) 10/06/2016   Male hypogonadism 10/06/2016   Stage 4 chronic kidney disease ([place]) 10/06/2016   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   IgA monoclonal gammopathy of uncertain significance 06/03/2016   CAD (coronary artery disease) 08/17/2010   Mixed hyperlipidemia 08/17/2010   Hypertension 08/17/2010   OSA (obstructive sleep apnea) 08/17/2010   Morbid obesity  ([place]) 08/17/2010   Esophageal reflux 06/19/2001   Hypersomnia with sleep apnea 04/30/2000   Hypertrophy of prostate without urinary obstruction and other lower urinary tract symptoms (LUTS) 04/30/1999   Proteinuria 04/30/1999    PCP: Majel Homer, PA  REFERRING PROVIDER: Ileene Rubens, MD  REFERRING DIAG: Other spondylosis with radiculopathy, lumbar region; Spinal stenosis of lumbar region with neurogenic claudication  Rationale for Evaluation and Treatment: Rehabilitation  THERAPY DIAG:  Other low back pain  Stiffness of left hip, not elsewhere classified  Stiffness of right hip, not elsewhere classified  Muscle spasm of back  ONSET DATE: 3 years ago  SUBJECTIVE:  SUBJECTIVE STATEMENT: Pt states that he hurt his back about 3 years ago when trying to pick his mother in law up from the floor. He was receiving injections which seemed to help, but this seemed to provide little relief at his most recent injection 3 months ago. He has some tingling in his Rt anterior thigh.   PERTINENT HISTORY:  DM, chronic back pain   PAIN:  Are you having pain? No- only with standing or walking immediate pain in the buttock region down to the knees Voltaren gel seems to help some with the pain  PRECAUTIONS: None  WEIGHT BEARING RESTRICTIONS: No  FALLS:  Has patient fallen in last 6 months? No  LIVING ENVIRONMENT: Lives with: lives with their family Lives in: House/apartment Stairs: No 6 steps at work Has following equipment at home: None  OCCUPATION: drive trucks   PLOF: Independent  PATIENT GOALS: decrease pain   NEXT MD VISIT: nothing set for now  OBJECTIVE:   DIAGNOSTIC FINDINGS:  05/22/21 IMPRESSION: Multilevel lumbar spondylosis.   Progressive disc protrusion at L2-3 with progressive  moderate spinal stenosis. Other levels are stable from 2020 MRI.  PATIENT SURVEYS:  Next visit  SCREENING FOR RED FLAGS: Bowel or bladder incontinence: No Spinal tumors: No Cauda equina syndrome: No Compression fracture: No Abdominal aneurysm: No  COGNITION: Overall cognitive status: Within functional limits for tasks assessed     SENSATION: Tingling Rt anterior thigh   MUSCLE LENGTH: Hamstrings: Right WNL deg; Left WNL deg Marcello Moores test: Right 100 deg; Left 100 deg  POSTURE:  forward trunk lean  PALPATION: Muscle spasm and tenderness in bilateral glutes  LUMBAR ROM:   AROM eval  Flexion Minimal spinal flexion noted (+) pain coming back up  Extension Unable to extend beyond neutral    Right lateral flexion   Left lateral flexion   Right rotation Limited   Left rotation (+) pain Lt back/buttock region   (Blank rows = not tested)  LOWER EXTREMITY ROM:     Active  Right eval Left eval  Hip flexion    Hip extension -5 -5  Hip abduction    Hip adduction    Hip internal rotation 10 10 (+)Rt buttock pain  Hip external rotation 20 20  Knee flexion    Knee extension    Ankle dorsiflexion    Ankle plantarflexion    Ankle inversion    Ankle eversion     (Blank rows = not tested)  LOWER EXTREMITY MMT:    MMT Right eval Left eval  Hip flexion 5 5  Hip extension 4 4  Hip abduction 4 4  Hip adduction    Hip internal rotation    Hip external rotation    Knee flexion 5 5  Knee extension 5 5  Ankle dorsiflexion 5 5  Ankle plantarflexion    Ankle inversion    Ankle eversion     (Blank rows = not tested)  LUMBAR SPECIAL TESTS:  Slump test: Negative  FUNCTIONAL TESTS:    GAIT: Distance walked: 61f Assistive device utilized: None Level of assistance: Complete Independence Comments: pain after walking 240f minimal trunk rotation   TODAY'S TREATMENT:  DATE: 01/02/22  Hooklying low trunk rotation x10 reps Lt side only due to pain Rt side SKTC with towel x10 reps  Thoracic rotation x10 reps each direction Manual thoracic    PATIENT EDUCATION:  Education details: eval findings/POC Person educated: Patient Education method: Explanation Education comprehension: verbalized understanding and returned demonstration  HOME EXERCISE PROGRAM: Access Code: [email or web address] URL: [email or web address] Date: 01/02/2022 Prepared by: [facility]  Exercises - Supine Lower Trunk Rotation  - 1 x daily - 7 x weekly - 1 sets - 10 reps - Supine Single Knee to Double Knee to Chest Stretch  - 1 x daily - 7 x weekly - 2 sets - 10 reps - 5 seconds  hold - Sidelying Thoracic Lumbar Rotation  - 1 x daily - 7 x weekly - 2 sets - 10 reps  ASSESSMENT:  CLINICAL IMPRESSION: Patient is a 71 y.o. M who was seen today for physical therapy evaluation and treatment for low back pain and stiffness ongoing for 3 years after attempting to lift his mother in law from the floor He was initially getting some relief from injections, but this has not been providing him much relief recently. He presents with significant limitations in his hip and trunk mobility limiting his ability to ambulate and complete bed mobility efficiently. He also had abdominal coning evident by poor trunk pressure management and weakness.   OBJECTIVE IMPAIRMENTS: Abnormal gait, decreased activity tolerance, decreased mobility, difficulty walking, decreased ROM, decreased strength, hypomobility, increased muscle spasms, impaired flexibility, improper body mechanics, postural dysfunction, obesity, and pain.   ACTIVITY LIMITATIONS: lifting, bending, standing, sleeping, bed mobility, and locomotion level  PARTICIPATION LIMITATIONS: cleaning, laundry, interpersonal relationship, community activity, yard work, and walking  PERSONAL  FACTORS: Age, Fitness, Past/current experiences, Profession, Time since onset of injury/illness/exacerbation, and 1 comorbidity: DM  are also affecting patient's functional outcome.   REHAB POTENTIAL: Good  CLINICAL DECISION MAKING: Stable/uncomplicated  EVALUATION COMPLEXITY: Low   GOALS: Goals reviewed with patient? Yes  SHORT TERM GOALS: Target date: 01/16/2022  Patient will be independent with his HEP Baseline: Goal status: INITIAL   LONG TERM GOALS: Target date: 02/27/2022  Pt will have improved lumbar active ROM with 50% improvement in pain in all directions.  Baseline:  Goal status: INITIAL  2.  Pt will be able to complete roll and bed mobility without coning compensation throughout his session. Baseline:  Goal status: INITIAL  3.  Pt will report atleast 50% improvement in his LBP from the start of PT. Baseline:  Goal status: INITIAL  4.  Pt will have 5/5 MMT strength of BLE. Baseline:  Goal status: INITIAL   PLAN:  PT FREQUENCY: 2x/week  PT DURATION: 8 weeks  PLANNED INTERVENTIONS: Therapeutic exercises, Therapeutic activity, Neuromuscular re-education, Balance training, Gait training, Patient/Family education, Self Care, Joint mobilization, Joint manipulation, Aquatic Therapy, Dry Needling, Spinal mobilization, Cryotherapy, Moist heat, Manual therapy, and Re-evaluation.  PLAN FOR NEXT SESSION: lumbar mobilizations; lumbar flexibility all directions, hip flexor stretch, core strengthening    11:47 AM,01/02/22 Sherol Dade PT, [place] [facility] [place]

## 2022-01-02 ENCOUNTER — Ambulatory Visit: Payer: 59 | Attending: Orthopedic Surgery | Admitting: Physical Therapy

## 2022-01-02 ENCOUNTER — Encounter: Payer: Self-pay | Admitting: Physical Therapy

## 2022-01-02 DIAGNOSIS — M48062 Spinal stenosis, lumbar region with neurogenic claudication: Secondary | ICD-10-CM | POA: Insufficient documentation

## 2022-01-02 DIAGNOSIS — M5459 Other low back pain: Secondary | ICD-10-CM | POA: Diagnosis not present

## 2022-01-02 DIAGNOSIS — M4726 Other spondylosis with radiculopathy, lumbar region: Secondary | ICD-10-CM | POA: Diagnosis not present

## 2022-01-02 DIAGNOSIS — M62830 Muscle spasm of back: Secondary | ICD-10-CM | POA: Insufficient documentation

## 2022-01-02 DIAGNOSIS — M25652 Stiffness of left hip, not elsewhere classified: Secondary | ICD-10-CM | POA: Insufficient documentation

## 2022-01-02 DIAGNOSIS — M25651 Stiffness of right hip, not elsewhere classified: Secondary | ICD-10-CM | POA: Insufficient documentation

## 2022-01-10 NOTE — Therapy (Signed)
OUTPATIENT PHYSICAL THERAPY THORACOLUMBAR EVALUATION   Patient Name: [name] [name] MRN: [id number] DOB:07-10-1950, 71 y.o., male Today's Date: 01/11/2022   PT End of Session - 01/11/22 0930     Visit Number 2    Date for PT Re-Evaluation 02/27/22    Authorization Type UHC    Authorization Time Period 01/02/22 to 02/27/22    Progress Note Due on Visit 10    PT Start Time 0930    PT Stop Time 1012    PT Time Calculation (min) 42 min    Activity Tolerance Patient tolerated treatment well    Behavior During Therapy [facility] for tasks assessed/performed              Past Medical History:  Diagnosis Date   Coronary artery disease    Nonobstructive per cath in 2005 and 2006 with 50% LAD stenosis, rechecked with IVUS  during cath in 2006.   Diabetes mellitus    Hyperlipidemia    Hypertension    Obesity, morbid (HCC)    OSA (obstructive sleep apnea)    Uses CPAP   Stage 4 chronic kidney disease ([place])    Per patient   Past Surgical History:  Procedure Laterality Date   CHOLECYSTECTOMY     NASAL SINUS SURGERY     Patient Active Problem List   Diagnosis Date Noted   Chronic low back pain with bilateral sciatica 09/04/2020   Joint stiffness 09/04/2020   Diaphragmatic hernia 11/22/2019   Osteoarthritis 11/22/2019   Type II or unspecified type diabetes mellitus without mention of complication, uncontrolled 03/18/2017   Mild nonproliferative diabetic retinopathy ([place]) 03/18/2017   Nuclear sclerosis 03/18/2017   ED (erectile dysfunction) of organic origin 10/06/2016   Elevated prostate specific antigen (PSA) 10/06/2016   Male hypogonadism 10/06/2016   Stage 4 chronic kidney disease ([place]) 10/06/2016   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   IgA monoclonal gammopathy of uncertain significance 06/03/2016   CAD (coronary artery disease) 08/17/2010   Mixed hyperlipidemia 08/17/2010   Hypertension 08/17/2010   OSA (obstructive sleep apnea) 08/17/2010   Morbid obesity  ([place]) 08/17/2010   Esophageal reflux 06/19/2001   Hypersomnia with sleep apnea 04/30/2000   Hypertrophy of prostate without urinary obstruction and other lower urinary tract symptoms (LUTS) 04/30/1999   Proteinuria 04/30/1999    PCP: Majel Homer, PA  REFERRING PROVIDER: Ileene Rubens, MD  REFERRING DIAG: Other spondylosis with radiculopathy, lumbar region; Spinal stenosis of lumbar region with neurogenic claudication  Rationale for Evaluation and Treatment: Rehabilitation  THERAPY DIAG:  Other low back pain  Stiffness of left hip, not elsewhere classified  Stiffness of right hip, not elsewhere classified  Muscle spasm of back  ONSET DATE: 3 years ago  SUBJECTIVE:  SUBJECTIVE STATEMENT: It's getting better, but still hurting but getting better. The exercises are helping.   PERTINENT HISTORY:  DM, chronic back pain   PAIN:  Are you having pain? Yes: NPRS scale: 8/10 Pain location: LBP Pain description: back and legs Aggravating factors: wa;ling Relieving factors: rest - only with standing or walking immediate pain in the buttock region down to the knees Voltaren gel seems to help some with the pain  PRECAUTIONS: None  WEIGHT BEARING RESTRICTIONS: No  FALLS:  Has patient fallen in last 6 months? No  LIVING ENVIRONMENT: Lives with: lives with their family Lives in: House/apartment Stairs: No 6 steps at work Has following equipment at home: None  OCCUPATION: drive trucks   PLOF: Independent  PATIENT GOALS: decrease pain   NEXT MD VISIT: nothing set for now  OBJECTIVE:   DIAGNOSTIC FINDINGS:  05/22/21 IMPRESSION: Multilevel lumbar spondylosis.   Progressive disc protrusion at L2-3 with progressive moderate spinal stenosis. Other levels are stable from 2020  MRI.  PATIENT SURVEYS:  Next visit  SCREENING FOR RED FLAGS: Bowel or bladder incontinence: No Spinal tumors: No Cauda equina syndrome: No Compression fracture: No Abdominal aneurysm: No  COGNITION: Overall cognitive status: Within functional limits for tasks assessed     SENSATION: Tingling Rt anterior thigh   MUSCLE LENGTH: Hamstrings: Right WNL deg; Left WNL deg Marcello Moores test: Right 100 deg; Left 100 deg  POSTURE:  forward trunk lean  PALPATION: Muscle spasm and tenderness in bilateral glutes  LUMBAR ROM:   AROM eval  Flexion Minimal spinal flexion noted (+) pain coming back up  Extension Unable to extend beyond neutral    Right lateral flexion   Left lateral flexion   Right rotation Limited   Left rotation (+) pain Lt back/buttock region   (Blank rows = not tested)  LOWER EXTREMITY ROM:     Active  Right eval Left eval  Hip flexion    Hip extension -5 -5  Hip abduction    Hip adduction    Hip internal rotation 10 10 (+)Rt buttock pain  Hip external rotation 20 20  Knee flexion    Knee extension    Ankle dorsiflexion    Ankle plantarflexion    Ankle inversion    Ankle eversion     (Blank rows = not tested)  LOWER EXTREMITY MMT:    MMT Right eval Left eval  Hip flexion 5 5  Hip extension 4 4  Hip abduction 4 4  Hip adduction    Hip internal rotation    Hip external rotation    Knee flexion 5 5  Knee extension 5 5  Ankle dorsiflexion 5 5  Ankle plantarflexion    Ankle inversion    Ankle eversion     (Blank rows = not tested)  LUMBAR SPECIAL TESTS:  Slump test: Negative  FUNCTIONAL TESTS:    GAIT: Distance walked: 50f Assistive device utilized: None Level of assistance: Complete Independence Comments: pain after walking 22f minimal trunk rotation   TODAY'S TREATMENT:  DATE:  01/11/22 NuStep L5x32mns  HS  stretch, SKTC 30s Feet on pball rotations and knees to chest Bridges 2x10 S2S 2x10  Leg ext 10# 2x10 HS curls 25# 2x10 Shoulder ext 10# 2x10  Rows and Lats 25# 2x10  blackTB ext 2x10    01/02/22  Hooklying low trunk rotation x10 reps Lt side only due to pain Rt side SKTC with towel x10 reps  Thoracic rotation x10 reps each direction Manual thoracic    PATIENT EDUCATION:  Education details: eval findings/POC Person educated: Patient Education method: Explanation Education comprehension: verbalized understanding and returned demonstration  HOME EXERCISE PROGRAM: Access Code: [email or web address]: [email or web address] Date: 01/02/2022 [facility]ared by: [facility] Exercises - Supine Lower Trunk Rotation  - 1 x daily - 7 x weekly - 1 sets - 10 reps - Supine Single Knee to Double Knee to Chest Stretch  - 1 x daily - 7 x weekly - 2 sets - 10 reps - 5 seconds  hold - Sidelying Thoracic Lumbar Rotation  - 1 x daily - 7 x weekly - 2 sets - 10 reps  ASSESSMENT:  CLINICAL IMPRESSION: Patient returns with back pain but states it has been getting better with the exercises. We started with some gentle stretching and followed with some functional strengthening. He gives a good effort throughout session, is limited by fatigue. Will benefit from more core strengthening. Cues needed to not lean with seated rows.   OBJECTIVE IMPAIRMENTS: Abnormal gait, decreased activity tolerance, decreased mobility, difficulty walking, decreased ROM, decreased strength, hypomobility, increased muscle spasms, impaired flexibility, improper body mechanics, postural dysfunction, obesity, and pain.   ACTIVITY LIMITATIONS: lifting, bending, standing, sleeping, bed mobility, and locomotion level  PARTICIPATION LIMITATIONS: cleaning, laundry, interpersonal relationship, community activity, yard work, and walking  PERSONAL FACTORS: Age, Fitness, Past/current  experiences, Profession, Time since onset of injury/illness/exacerbation, and 1 comorbidity: DM  are also affecting patient's functional outcome.   REHAB POTENTIAL: Good  CLINICAL DECISION MAKING: Stable/uncomplicated  EVALUATION COMPLEXITY: Low   GOALS: Goals reviewed with patient? Yes  SHORT TERM GOALS: Target date: 01/25/2022  Patient will be independent with his HEP Baseline: Goal status: INITIAL   LONG TERM GOALS: Target date: 03/08/2022  Pt will have improved lumbar active ROM with 50% improvement in pain in all directions.  Baseline:  Goal status: INITIAL  2.  Pt will be able to complete roll and bed mobility without coning compensation throughout his session. Baseline:  Goal status: INITIAL  3.  Pt will report atleast 50% improvement in his LBP from the start of PT. Baseline:  Goal status: INITIAL  4.  Pt will have 5/5 MMT strength of BLE. Baseline:  Goal status: INITIAL   PLAN:  PT FREQUENCY: 2x/week  PT DURATION: 8 weeks  PLANNED INTERVENTIONS: Therapeutic exercises, Therapeutic activity, Neuromuscular re-education, Balance training, Gait training, Patient/Family education, Self Care, Joint mobilization, Joint manipulation, Aquatic Therapy, Dry Needling, Spinal mobilization, Cryotherapy, Moist heat, Manual therapy, and Re-evaluation.  PLAN FOR NEXT SESSION: lumbar mobilizations; lumbar flexibility all directions, hip flexor stretch, core strengthening    10:12 AM,01/11/22 MAndris Baumann [place]at [place]

## 2022-01-11 ENCOUNTER — Ambulatory Visit: Payer: 59

## 2022-01-11 DIAGNOSIS — M25652 Stiffness of left hip, not elsewhere classified: Secondary | ICD-10-CM

## 2022-01-11 DIAGNOSIS — M25651 Stiffness of right hip, not elsewhere classified: Secondary | ICD-10-CM

## 2022-01-11 DIAGNOSIS — M62830 Muscle spasm of back: Secondary | ICD-10-CM

## 2022-01-11 DIAGNOSIS — M5459 Other low back pain: Secondary | ICD-10-CM

## 2022-01-16 NOTE — Therapy (Signed)
OUTPATIENT PHYSICAL THERAPY THORACOLUMBAR TREATMENT   Patient Name: [name] [name] MRN: [id number] DOB:09/01/1950, 71 y.o., male Today's Date: 01/21/2022   PT End of Session - 01/21/22 0930     Visit Number 3    Date for PT Re-Evaluation 02/27/22    Authorization Type U[employer]    Authorization Time Period 01/02/22 to 02/27/22    Progress Note Due on Visit 10    PT Start Time 0930    PT Stop Time 1015    PT Time Calculation (min) 45 min    Activity Tolerance Patient tolerated treatment well    Behavior During Therapy [facility] for tasks assessed/performed              Past Medical History:  Diagnosis Date   Coronary artery disease    Nonobstructive per cath in 2005 and 2006 with 50% LAD stenosis, rechecked with IVUS  during cath in 2006.   Diabetes mellitus    Hyperlipidemia    Hypertension    Obesity, morbid (HCC)    OSA (obstructive sleep apnea)    Uses CPAP   Stage 4 chronic kidney disease ([place])    Per patient   Past Surgical History:  Procedure Laterality Date   CHOLECYSTECTOMY     NASAL SINUS SURGERY     Patient Active Problem List   Diagnosis Date Noted   Chronic low back pain with bilateral sciatica 09/04/2020   Joint stiffness 09/04/2020   Diaphragmatic hernia 11/22/2019   Osteoarthritis 11/22/2019   Type II or unspecified type diabetes mellitus without mention of complication, uncontrolled 03/18/2017   Mild nonproliferative diabetic retinopathy ([place]) 03/18/2017   Nuclear sclerosis 03/18/2017   ED (erectile dysfunction) of organic origin 10/06/2016   Elevated prostate specific antigen (PSA) 10/06/2016   Male hypogonadism 10/06/2016   Stage 4 chronic kidney disease ([place]) 10/06/2016   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   IgA monoclonal gammopathy of uncertain significance 06/03/2016   CAD (coronary artery disease) 08/17/2010   Mixed hyperlipidemia 08/17/2010   Hypertension 08/17/2010   OSA (obstructive sleep apnea) 08/17/2010   Morbid obesity  ([place]) 08/17/2010   Esophageal reflux 06/19/2001   Hypersomnia with sleep apnea 04/30/2000   Hypertrophy of prostate without urinary obstruction and other lower urinary tract symptoms (LUTS) 04/30/1999   Proteinuria 04/30/1999    PCP: Majel Homer, PA  REFERRING PROVIDER: Ileene Rubens, MD  REFERRING DIAG: Other spondylosis with radiculopathy, lumbar region; Spinal stenosis of lumbar region with neurogenic claudication  Rationale for Evaluation and Treatment: Rehabilitation  THERAPY DIAG:  Other low back pain  Stiffness of left hip, not elsewhere classified  Stiffness of right hip, not elsewhere classified  Muscle spasm of back  ONSET DATE: 3 years ago  SUBJECTIVE:  SUBJECTIVE STATEMENT: I am sore all over, I did a lot of walking yesterday and had to change the sealant on a toilet.   PERTINENT HISTORY:  DM, chronic back pain   PAIN:  Are you having pain? Yes: NPRS scale: 7/10 Pain location: LBP Pain description: back and legs Aggravating factors: wa;ling Relieving factors: rest - only with standing or walking immediate pain in the buttock region down to the knees Voltaren gel seems to help some with the pain  PRECAUTIONS: None  WEIGHT BEARING RESTRICTIONS: No  FALLS:  Has patient fallen in last 6 months? No  LIVING ENVIRONMENT: Lives with: lives with their family Lives in: House/apartment Stairs: No 6 steps at work Has following equipment at home: None  OCCUPATION: drive trucks   PLOF: Independent  PATIENT GOALS: decrease pain   NEXT MD VISIT: nothing set for now  OBJECTIVE:   DIAGNOSTIC FINDINGS:  05/22/21 IMPRESSION: Multilevel lumbar spondylosis.   Progressive disc protrusion at L2-3 with progressive moderate spinal stenosis. Other levels are stable from 2020  MRI.  PATIENT SURVEYS:  Next visit  SCREENING FOR RED FLAGS: Bowel or bladder incontinence: No Spinal tumors: No Cauda equina syndrome: No Compression fracture: No Abdominal aneurysm: No  COGNITION: Overall cognitive status: Within functional limits for tasks assessed     SENSATION: Tingling Rt anterior thigh   MUSCLE LENGTH: Hamstrings: Right WNL deg; Left WNL deg Marcello Moores test: Right 100 deg; Left 100 deg  POSTURE:  forward trunk lean  PALPATION: Muscle spasm and tenderness in bilateral glutes  LUMBAR ROM:   AROM eval  Flexion Minimal spinal flexion noted (+) pain coming back up  Extension Unable to extend beyond neutral    Right lateral flexion   Left lateral flexion   Right rotation Limited   Left rotation (+) pain Lt back/buttock region   (Blank rows = not tested)  LOWER EXTREMITY ROM:     Active  Right eval Left eval  Hip flexion    Hip extension -5 -5  Hip abduction    Hip adduction    Hip internal rotation 10 10 (+)Rt buttock pain  Hip external rotation 20 20  Knee flexion    Knee extension    Ankle dorsiflexion    Ankle plantarflexion    Ankle inversion    Ankle eversion     (Blank rows = not tested)  LOWER EXTREMITY MMT:    MMT Right eval Left eval  Hip flexion 5 5  Hip extension 4 4  Hip abduction 4 4  Hip adduction    Hip internal rotation    Hip external rotation    Knee flexion 5 5  Knee extension 5 5  Ankle dorsiflexion 5 5  Ankle plantarflexion    Ankle inversion    Ankle eversion     (Blank rows = not tested)  LUMBAR SPECIAL TESTS:  Slump test: Negative  FUNCTIONAL TESTS:    GAIT: Distance walked: 7f Assistive device utilized: None Level of assistance: Complete Independence Comments: pain after walking 219f minimal trunk rotation   TODAY'S TREATMENT:  DATE:  01/21/22 Bike L1 x33mns   Ball roll outs x10 Shoulder ext 10# 2x10  Cable rows 15# 2x10 Feet on pball rotations, K2C, small bridges x10 each HS stretch, knee to chest 30s  AR press 20# 2x10  blackTB crunches 2x10 Calf stretch 30s  Calf raises 2x10  01/11/22 NuStep L5x629ms  HS stretch, SKTC 30s Feet on pball rotations and knees to chest Bridges 2x10 S2S 2x10  Leg ext 10# 2x10 HS curls 25# 2x10 Shoulder ext 10# 2x10  Rows and Lats 25# 2x10  blackTB ext 2x10    01/02/22  Hooklying low trunk rotation x10 reps Lt side only due to pain Rt side SKTC with towel x10 reps  Thoracic rotation x10 reps each direction Manual thoracic    PATIENT EDUCATION:  Education details: eval findings/POC Person educated: Patient Education method: Explanation Education comprehension: verbalized understanding and returned demonstration  HOME EXERCISE PROGRAM: Access Code: [email or web address]: [email or web address] Date: 01/02/2022 Prepared by: [facility] ClinicExercises - Supine Lower Trunk Rotation  - 1 x daily - 7 x weekly - 1 sets - 10 reps - Supine Single Knee to Double Knee to Chest Stretch  - 1 x daily - 7 x weekly - 2 sets - 10 reps - 5 seconds  hold - Sidelying Thoracic Lumbar Rotation  - 1 x daily - 7 x weekly - 2 sets - 10 reps  ASSESSMENT:  CLINICAL IMPRESSION: Patient returns with back pain mostly soreness due to increased activity the past few days. Cues needed for cable rows. He gives a good effort throughout session, is limited by fatigue needed sitting rest break in between cable activities.   OBJECTIVE IMPAIRMENTS: Abnormal gait, decreased activity tolerance, decreased mobility, difficulty walking, decreased ROM, decreased strength, hypomobility, increased muscle spasms, impaired flexibility, improper body mechanics, postural dysfunction, obesity, and pain.   ACTIVITY LIMITATIONS: lifting, bending, standing, sleeping, bed mobility, and locomotion  level  PARTICIPATION LIMITATIONS: cleaning, laundry, interpersonal relationship, community activity, yard work, and walking  PERSONAL FACTORS: Age, Fitness, Past/current experiences, Profession, Time since onset of injury/illness/exacerbation, and 1 comorbidity: DM  are also affecting patient's functional outcome.   REHAB POTENTIAL: Good  CLINICAL DECISION MAKING: Stable/uncomplicated  EVALUATION COMPLEXITY: Low   GOALS: Goals reviewed with patient? Yes  SHORT TERM GOALS: Target date: 02/04/2022  Patient will be independent with his HEP Baseline: Goal status: INITIAL   LONG TERM GOALS: Target date: 03/18/2022  Pt will have improved lumbar active ROM with 50% improvement in pain in all directions.  Baseline:  Goal status: INITIAL  2.  Pt will be able to complete roll and bed mobility without coning compensation throughout his session. Baseline:  Goal status: INITIAL  3.  Pt will report atleast 50% improvement in his LBP from the start of PT. Baseline:  Goal status: INITIAL  4.  Pt will have 5/5 MMT strength of BLE. Baseline:  Goal status: INITIAL   PLAN:  PT FREQUENCY: 2x/week  PT DURATION: 8 weeks  PLANNED INTERVENTIONS: Therapeutic exercises, Therapeutic activity, Neuromuscular re-education, Balance training, Gait training, Patient/Family education, Self Care, Joint mobilization, Joint manipulation, Aquatic Therapy, Dry Needling, Spinal mobilization, Cryotherapy, Moist heat, Manual therapy, and Re-evaluation.  PLAN FOR NEXT SESSION: lumbar mobilizations; lumbar flexibility all directions, hip flexor stretch, core strengthening    10:12 AM,01/21/22 MoAndris BaumannDPBeaver Creekt BrAleknagik

## 2022-01-21 ENCOUNTER — Ambulatory Visit: Payer: 59

## 2022-01-21 DIAGNOSIS — M5459 Other low back pain: Secondary | ICD-10-CM

## 2022-01-21 DIAGNOSIS — M25651 Stiffness of right hip, not elsewhere classified: Secondary | ICD-10-CM

## 2022-01-21 DIAGNOSIS — M62830 Muscle spasm of back: Secondary | ICD-10-CM

## 2022-01-21 DIAGNOSIS — M25652 Stiffness of left hip, not elsewhere classified: Secondary | ICD-10-CM

## 2022-01-23 ENCOUNTER — Ambulatory Visit: Payer: 59 | Admitting: Physical Therapy

## 2022-01-25 ENCOUNTER — Ambulatory Visit: Payer: 59 | Attending: Orthopedic Surgery | Admitting: Physical Therapy

## 2022-01-25 DIAGNOSIS — M62830 Muscle spasm of back: Secondary | ICD-10-CM | POA: Diagnosis present

## 2022-01-25 DIAGNOSIS — M5459 Other low back pain: Secondary | ICD-10-CM | POA: Diagnosis present

## 2022-01-25 DIAGNOSIS — M25652 Stiffness of left hip, not elsewhere classified: Secondary | ICD-10-CM | POA: Insufficient documentation

## 2022-01-25 DIAGNOSIS — M25651 Stiffness of right hip, not elsewhere classified: Secondary | ICD-10-CM | POA: Insufficient documentation

## 2022-01-25 NOTE — Therapy (Signed)
OUTPATIENT PHYSICAL THERAPY THORACOLUMBAR TREATMENT   Patient Name: [name] [name] MRN: [id number] DOB:10/30/1950, 71 y.o., male Today's Date: 01/25/2022   PT End of Session - 01/25/22 1153     Visit Number 4    Date for PT Re-Evaluation 02/27/22    Authorization Type UHC    Authorization Time Period 01/02/22 to 02/27/22    Progress Note Due on Visit 10    PT Start Time 0930    PT Stop Time 1015    PT Time Calculation (min) 45 min    Activity Tolerance Patient tolerated treatment well;No increased pain    Behavior During Therapy [facility] for tasks assessed/performed               Past Medical History:  Diagnosis Date   Coronary artery disease    Nonobstructive per cath in 2005 and 2006 with 50% LAD stenosis, rechecked with IVUS  during cath in 2006.   Diabetes mellitus    Hyperlipidemia    Hypertension    Obesity, morbid (HCC)    OSA (obstructive sleep apnea)    Uses CPAP   Stage 4 chronic kidney disease ([place])    Per patient   Past Surgical History:  Procedure Laterality Date   CHOLECYSTECTOMY     NASAL SINUS SURGERY     Patient Active Problem List   Diagnosis Date Noted   Chronic low back pain with bilateral sciatica 09/04/2020   Joint stiffness 09/04/2020   Diaphragmatic hernia 11/22/2019   Osteoarthritis 11/22/2019   Type II or unspecified type diabetes mellitus without mention of complication, uncontrolled 03/18/2017   Mild nonproliferative diabetic retinopathy ([place]) 03/18/2017   Nuclear sclerosis 03/18/2017   ED (erectile dysfunction) of organic origin 10/06/2016   Elevated prostate specific antigen (PSA) 10/06/2016   Male hypogonadism 10/06/2016   Stage 4 chronic kidney disease ([place]) 10/06/2016   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   IgA monoclonal gammopathy of uncertain significance 06/03/2016   CAD (coronary artery disease) 08/17/2010   Mixed hyperlipidemia 08/17/2010   Hypertension 08/17/2010   OSA (obstructive sleep apnea) 08/17/2010    Morbid obesity ([place]) 08/17/2010   Esophageal reflux 06/19/2001   Hypersomnia with sleep apnea 04/30/2000   Hypertrophy of prostate without urinary obstruction and other lower urinary tract symptoms (LUTS) 04/30/1999   Proteinuria 04/30/1999    PCP: Majel Homer, PA  REFERRING PROVIDER: Ileene Rubens, MD  REFERRING DIAG: Other spondylosis with radiculopathy, lumbar region; Spinal stenosis of lumbar region with neurogenic claudication  Rationale for Evaluation and Treatment: Rehabilitation  THERAPY DIAG:  Other low back pain  Stiffness of left hip, not elsewhere classified  Muscle spasm of back  Stiffness of right hip, not elsewhere classified  ONSET DATE: 3 years ago  SUBJECTIVE:  SUBJECTIVE STATEMENT: Pt states he is doing ok. He is completing his HEP without issue. Pain is improving.   PERTINENT HISTORY:  DM, chronic back pain   PAIN:  Are you having pain? Yes: NPRS scale: 6/10 Pain location: LBP Pain description: back and legs Aggravating factors: walking Relieving factors: rest - only with standing or walking immediate pain in the buttock region down to the knees Voltaren gel seems to help some with the pain  PRECAUTIONS: None  WEIGHT BEARING RESTRICTIONS: No  FALLS:  Has patient fallen in last 6 months? No  LIVING ENVIRONMENT: Lives with: lives with their family Lives in: House/apartment Stairs: No 6 steps at work Has following equipment at home: None  OCCUPATION: drive trucks   PLOF: Independent  PATIENT GOALS: decrease pain   NEXT MD VISIT: nothing set for now  OBJECTIVE:   DIAGNOSTIC FINDINGS:  05/22/21 IMPRESSION: Multilevel lumbar spondylosis.   Progressive disc protrusion at L2-3 with progressive moderate spinal stenosis. Other levels are stable from  2020 MRI.  PATIENT SURVEYS:  Next visit  SCREENING FOR RED FLAGS: Bowel or bladder incontinence: No Spinal tumors: No Cauda equina syndrome: No Compression fracture: No Abdominal aneurysm: No  COGNITION: Overall cognitive status: Within functional limits for tasks assessed     SENSATION: Tingling Rt anterior thigh   MUSCLE LENGTH: Hamstrings: Right WNL deg; Left WNL deg Marcello Moores test: Right 100 deg; Left 100 deg  POSTURE:  forward trunk lean  PALPATION: Muscle spasm and tenderness in bilateral glutes  LUMBAR ROM:   AROM eval  Flexion Minimal spinal flexion noted (+) pain coming back up  Extension Unable to extend beyond neutral    Right lateral flexion   Left lateral flexion   Right rotation Limited   Left rotation (+) pain Lt back/buttock region   (Blank rows = not tested)  LOWER EXTREMITY ROM:     Active  Right eval Left eval  Hip flexion    Hip extension -5 -5  Hip abduction    Hip adduction    Hip internal rotation 10 10 (+)Rt buttock pain  Hip external rotation 20 20  Knee flexion    Knee extension    Ankle dorsiflexion    Ankle plantarflexion    Ankle inversion    Ankle eversion     (Blank rows = not tested)  LOWER EXTREMITY MMT:    MMT Right eval Left eval  Hip flexion 5 5  Hip extension 4 4  Hip abduction 4 4  Hip adduction    Hip internal rotation    Hip external rotation    Knee flexion 5 5  Knee extension 5 5  Ankle dorsiflexion 5 5  Ankle plantarflexion    Ankle inversion    Ankle eversion     (Blank rows = not tested)  LUMBAR SPECIAL TESTS:  Slump test: Negative  FUNCTIONAL TESTS:    GAIT: Distance walked: 74f Assistive device utilized: None Level of assistance: Complete Independence Comments: pain after walking 231f minimal trunk rotation   TODAY'S TREATMENT:  DATE:  01/25/22: Sidelying  thoracic rotation x10 each side  Supine abdominal pressure management with breathing and verbal/tactile cuing 2x10 Supine diagonal chops with 1 UE green TB proper abdominal pressure maintained Supine diagonal chops with 2 UE green TB x5 reps Supine LE on green physioball LTR x10 Seated lat pulldowns #35 x10 reps (+) coning noted Seated TrA activation 10x5sec hold  Standing quad stretch in chair 2x30 sec    01/21/22 Bike L1 x4mns  Ball roll outs x10 Shoulder ext 10# 2x10  Cable rows 15# 2x10 Feet on pball rotations, K2C, small bridges x10 each HS stretch, knee to chest 30s  AR press 20# 2x10  blackTB crunches 2x10 Calf stretch 30s  Calf raises 2x10  01/11/22 NuStep L5x647ms  HS stretch, SKTC 30s Feet on pball rotations and knees to chest Bridges 2x10 S2S 2x10  Leg ext 10# 2x10 HS curls 25# 2x10 Shoulder ext 10# 2x10  Rows and Lats 25# 2x10  blackTB ext 2x10    01/02/22  Hooklying low trunk rotation x10 reps Lt side only due to pain Rt side SKTC with towel x10 reps  Thoracic rotation x10 reps each direction Manual thoracic    PATIENT EDUCATION:  Education details: eval findings/POC Person educated: Patient Education method: Explanation Education comprehension: verbalized understanding and returned demonstration  HOME EXERCISE PROGRAM: Access Code: [email or web address]: [email or web address] Date: 01/02/2022 Prepared by: MCR[facility] ClinicExercises - Supine Lower Trunk Rotation  - 1 x daily - 7 x weekly - 1 sets - 10 reps - Supine Single Knee to Double Knee to Chest Stretch  - 1 x daily - 7 x weekly - 2 sets - 10 reps - 5 seconds  hold - Sidelying Thoracic Lumbar Rotation  - 1 x daily - 7 x weekly - 2 sets - 10 reps  ASSESSMENT:  CLINICAL IMPRESSION: Patient is doing well with his stretches at home. Session focused on education of proper core pressure management. Pt demonstrates abdominal coning initially with  attempts at TrA activation and trunk diagonals. Pt required heavy verbal/tactile cuing to improve abdominal activation. His HEP was updated to reflect today's exercises. He denied increase in low back pain end of session.   OBJECTIVE IMPAIRMENTS: Abnormal gait, decreased activity tolerance, decreased mobility, difficulty walking, decreased ROM, decreased strength, hypomobility, increased muscle spasms, impaired flexibility, improper body mechanics, postural dysfunction, obesity, and pain.   ACTIVITY LIMITATIONS: lifting, bending, standing, sleeping, bed mobility, and locomotion level  PARTICIPATION LIMITATIONS: cleaning, laundry, interpersonal relationship, community activity, yard work, and walking  PERSONAL FACTORS: Age, Fitness, Past/current experiences, Profession, Time since onset of injury/illness/exacerbation, and 1 comorbidity: DM  are also affecting patient's functional outcome.   REHAB POTENTIAL: Good  CLINICAL DECISION MAKING: Stable/uncomplicated  EVALUATION COMPLEXITY: Low   GOALS: Goals reviewed with patient? Yes  SHORT TERM GOALS: Target date: 02/08/2022  Patient will be independent with his HEP Baseline: Goal status: INITIAL   LONG TERM GOALS: Target date: 03/22/2022  Pt will have improved lumbar active ROM with 50% improvement in pain in all directions.  Baseline:  Goal status: INITIAL  2.  Pt will be able to complete roll and bed mobility without coning compensation throughout his session. Baseline:  Goal status: INITIAL  3.  Pt will report atleast 50% improvement in his LBP from the start of PT. Baseline:  Goal status: INITIAL  4.  Pt will have 5/5 MMT strength of BLE. Baseline:  Goal status: INITIAL   PLAN:  PT FREQUENCY: 2x/week  PT DURATION: 8 weeks  PLANNED INTERVENTIONS: Therapeutic exercises, Therapeutic activity, Neuromuscular re-education, Balance training, Gait training, Patient/Family education, Self Care, Joint mobilization, Joint  manipulation, Aquatic Therapy, Dry Needling, Spinal mobilization, Cryotherapy, Moist heat, Manual therapy, and Re-evaluation.  PLAN FOR NEXT SESSION: lumbar mobilizations; lumbar flexibility all directions, progress abdominal strength  11:54 AM,01/25/22 Sherol Dade PT, DPT [place] [facility] [place]

## 2022-01-28 ENCOUNTER — Encounter: Payer: Self-pay | Admitting: Physical Therapy

## 2022-01-28 ENCOUNTER — Ambulatory Visit: Payer: 59 | Admitting: Physical Therapy

## 2022-01-28 DIAGNOSIS — M5459 Other low back pain: Secondary | ICD-10-CM | POA: Diagnosis not present

## 2022-01-28 DIAGNOSIS — M62830 Muscle spasm of back: Secondary | ICD-10-CM

## 2022-01-28 DIAGNOSIS — M25652 Stiffness of left hip, not elsewhere classified: Secondary | ICD-10-CM

## 2022-01-28 NOTE — Therapy (Signed)
OUTPATIENT PHYSICAL THERAPY THORACOLUMBAR TREATMENT   Patient Name: [name] [name] MRN: [id number] DOB:18-Jan-1951, 71 y.o., male Today's Date: 01/28/2022   PT End of Session - 01/28/22 0926     Visit Number 5    Date for PT Re-Evaluation 02/27/22    Authorization Type UHC    Authorization Time Period 01/02/22 to 02/27/22    PT Start Time 0925    PT Stop Time 1012    PT Time Calculation (min) 47 min    Activity Tolerance Patient tolerated treatment well;No increased pain    Behavior During Therapy [facility] for tasks assessed/performed               Past Medical History:  Diagnosis Date   Coronary artery disease    Nonobstructive per cath in 2005 and 2006 with 50% LAD stenosis, rechecked with IVUS  during cath in 2006.   Diabetes mellitus    Hyperlipidemia    Hypertension    Obesity, morbid (HCC)    OSA (obstructive sleep apnea)    Uses CPAP   Stage 4 chronic kidney disease ([place])    Per patient   Past Surgical History:  Procedure Laterality Date   CHOLECYSTECTOMY     NASAL SINUS SURGERY     Patient Active Problem List   Diagnosis Date Noted   Chronic low back pain with bilateral sciatica 09/04/2020   Joint stiffness 09/04/2020   Diaphragmatic hernia 11/22/2019   Osteoarthritis 11/22/2019   Type II or unspecified type diabetes mellitus without mention of complication, uncontrolled 03/18/2017   Mild nonproliferative diabetic retinopathy ([place]) 03/18/2017   Nuclear sclerosis 03/18/2017   ED (erectile dysfunction) of organic origin 10/06/2016   Elevated prostate specific antigen (PSA) 10/06/2016   Male hypogonadism 10/06/2016   Stage 4 chronic kidney disease ([place]) 10/06/2016   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   IgA monoclonal gammopathy of uncertain significance 06/03/2016   CAD (coronary artery disease) 08/17/2010   Mixed hyperlipidemia 08/17/2010   Hypertension 08/17/2010   OSA (obstructive sleep apnea) 08/17/2010   Morbid obesity ([place]) 08/17/2010    Esophageal reflux 06/19/2001   Hypersomnia with sleep apnea 04/30/2000   Hypertrophy of prostate without urinary obstruction and other lower urinary tract symptoms (LUTS) 04/30/1999   Proteinuria 04/30/1999    PCP: Majel Homer, PA  REFERRING PROVIDER: Ileene Rubens, MD  REFERRING DIAG: Other spondylosis with radiculopathy, lumbar region; Spinal stenosis of lumbar region with neurogenic claudication  Rationale for Evaluation and Treatment: Rehabilitation  THERAPY DIAG:  Other low back pain  Stiffness of left hip, not elsewhere classified  Muscle spasm of back  ONSET DATE: 3 years ago  SUBJECTIVE:  SUBJECTIVE STATEMENT: I feel like I am doing better, just really stiff 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   PERTINENT HISTORY:  DM, chronic back pain   PAIN:  Are you having pain? Yes: NPRS scale: 6/10 Pain location: LBP Pain description: back and legs Aggravating factors: walking Relieving factors: rest - only with standing or walking immediate pain in the buttock region down to the knees Voltaren gel seems to help some with the pain  PRECAUTIONS: None  WEIGHT BEARING RESTRICTIONS: No  FALLS:  Has patient fallen in last 6 months? No  LIVING ENVIRONMENT: Lives with: lives with their family Lives in: House/apartment Stairs: No 6 steps at work Has following equipment at home: None  OCCUPATION: drive trucks   PLOF: Independent  PATIENT GOALS: decrease pain   NEXT MD VISIT: nothing set for now  OBJECTIVE:   DIAGNOSTIC FINDINGS:  05/22/21 IMPRESSION: Multilevel lumbar spondylosis.   Progressive disc protrusion at L2-3 with progressive moderate spinal stenosis. Other levels are stable from 2020 MRI.  PATIENT SURVEYS:  Next visit  SCREENING FOR RED FLAGS: Bowel or bladder  incontinence: No Spinal tumors: No Cauda equina syndrome: No Compression fracture: No Abdominal aneurysm: No  COGNITION: Overall cognitive status: Within functional limits for tasks assessed     SENSATION: Tingling Rt anterior thigh   MUSCLE LENGTH: Hamstrings: Right WNL deg; Left WNL deg Marcello Moores test: Right 100 deg; Left 100 deg  POSTURE:  forward trunk lean  PALPATION: Muscle spasm and tenderness in bilateral glutes  LUMBAR ROM:   AROM eval  Flexion Minimal spinal flexion noted (+) pain coming back up  Extension Unable to extend beyond neutral    Right lateral flexion   Left lateral flexion   Right rotation Limited   Left rotation (+) pain Lt back/buttock region   (Blank rows = not tested)  LOWER EXTREMITY ROM:     Active  Right eval Left eval  Hip flexion    Hip extension -5 -5  Hip abduction    Hip adduction    Hip internal rotation 10 10 (+)Rt buttock pain  Hip external rotation 20 20  Knee flexion    Knee extension    Ankle dorsiflexion    Ankle plantarflexion    Ankle inversion    Ankle eversion     (Blank rows = not tested)  LOWER EXTREMITY MMT:    MMT Right eval Left eval  Hip flexion 5 5  Hip extension 4 4  Hip abduction 4 4  Hip adduction    Hip internal rotation    Hip external rotation    Knee flexion 5 5  Knee extension 5 5  Ankle dorsiflexion 5 5  Ankle plantarflexion    Ankle inversion    Ankle eversion     (Blank rows = not tested)  LUMBAR SPECIAL TESTS:  Slump test: Negative  FUNCTIONAL TESTS:    GAIT: Distance walked: 71f Assistive device utilized: None Level of assistance: Complete Independence Comments: pain after walking 274f minimal trunk rotation   TODAY'S TREATMENT:  DATE:  01/28/22 Nustep level 5 x 6 minutes Leg extension 10# 2x10 Leg curls 25# 2x10 Seated row 20# Lats 20# Black  tband lumbar extension 35# triceps Ball roll outs in sitting Feet on ball K2C, trunk rotation, posterior isometric, abdominal isometrics Passive HS and piriformis stretch   01/25/22: Sidelying thoracic rotation x10 each side  Supine abdominal pressure management with breathing and verbal/tactile cuing 2x10 Supine diagonal chops with 1 UE green TB proper abdominal pressure maintained Supine diagonal chops with 2 UE green TB x5 reps Supine LE on green physioball LTR x10 Seated lat pulldowns #35 x10 reps (+) coning noted Seated TrA activation 10x5sec hold  Standing quad stretch in chair 2x30 sec    01/21/22 Bike L1 x31mns  Ball roll outs x10 Shoulder ext 10# 2x10  Cable rows 15# 2x10 Feet on pball rotations, K2C, small bridges x10 each HS stretch, knee to chest 30s  AR press 20# 2x10  blackTB crunches 2x10 Calf stretch 30s  Calf raises 2x10  01/11/22 NuStep L5x672ms  HS stretch, SKTC 30s Feet on pball rotations and knees to chest Bridges 2x10 S2S 2x10  Leg ext 10# 2x10 HS curls 25# 2x10 Shoulder ext 10# 2x10  Rows and Lats 25# 2x10  blackTB ext 2x10    01/02/22  Hooklying low trunk rotation x10 reps Lt side only due to pain Rt side SKTC with towel x10 reps  Thoracic rotation x10 reps each direction Manual thoracic    PATIENT EDUCATION:  Education details: eval findings/POC Person educated: Patient Education method: Explanation Education comprehension: verbalized understanding and returned demonstration  HOME EXERCISE PROGRAM: Access Code: [email or web address]: [email or web address] Date: 01/02/2022 Prepared by: [facility] - Supine Lower Trunk Rotation  - 1 x daily - 7 x weekly - 1 sets - 10 reps - Supine Single Knee to Double Knee to Chest Stretch  - 1 x daily - 7 x weekly - 2 sets - 10 reps - 5 seconds  hold - Sidelying Thoracic Lumbar Rotation  - 1 x daily - 7 x weekly - 2 sets - 10  reps  ASSESSMENT:  CLINICAL IMPRESSION: Patient continues to report improvement, reports that he is very stiff after his shift of driving.  Has weakness in the core and tightness in the LE's, no increase of pain during the treatment session, he does struggle to get up to sitting from supine  OBJECTIVE IMPAIRMENTS: Abnormal gait, decreased activity tolerance, decreased mobility, difficulty walking, decreased ROM, decreased strength, hypomobility, increased muscle spasms, impaired flexibility, improper body mechanics, postural dysfunction, obesity, and pain.   ACTIVITY LIMITATIONS: lifting, bending, standing, sleeping, bed mobility, and locomotion level  PARTICIPATION LIMITATIONS: cleaning, laundry, interpersonal relationship, community activity, yard work, and walking  PERSONAL FACTORS: Age, Fitness, Past/current experiences, Profession, Time since onset of injury/illness/exacerbation, and 1 comorbidity: DM  are also affecting patient's functional outcome.   REHAB POTENTIAL: Good  CLINICAL DECISION MAKING: Stable/uncomplicated  EVALUATION COMPLEXITY: Low   GOALS: Goals reviewed with patient? Yes  SHORT TERM GOALS: Target date: 02/04/22  Patient will be independent with his HEP Baseline: Goal status: met   LONG TERM GOALS: Target date: 03/25/22  Pt will have improved lumbar active ROM with 50% improvement in pain in all directions.  Baseline:  Goal status: progressing  2.  Pt will be able to complete roll and bed mobility without coning compensation throughout his session. Baseline:  Goal status: progressing  3.  Pt will report atleast 50%  improvement in his LBP from the start of PT. Baseline:  Goal status: INITIAL  4.  Pt will have 5/5 MMT strength of BLE. Baseline:  Goal status: INITIAL   PLAN:  PT FREQUENCY: 2x/week  PT DURATION: 8 weeks  PLANNED INTERVENTIONS: Therapeutic exercises, Therapeutic activity, Neuromuscular re-education, Balance training, Gait  training, Patient/Family education, Self Care, Joint mobilization, Joint manipulation, Aquatic Therapy, Dry Needling, Spinal mobilization, Cryotherapy, Moist heat, Manual therapy, and Re-evaluation.  PLAN FOR NEXT SESSION: lumbar mobilizations; lumbar flexibility all directions, progress abdominal strength  9:26 AM,01/28/22 Lum Babe, [place] [facility] [place]

## 2022-01-30 ENCOUNTER — Encounter: Payer: Self-pay | Admitting: Physical Therapy

## 2022-01-30 ENCOUNTER — Ambulatory Visit: Payer: 59 | Admitting: Physical Therapy

## 2022-01-30 DIAGNOSIS — M25651 Stiffness of right hip, not elsewhere classified: Secondary | ICD-10-CM

## 2022-01-30 DIAGNOSIS — M25652 Stiffness of left hip, not elsewhere classified: Secondary | ICD-10-CM

## 2022-01-30 DIAGNOSIS — M5459 Other low back pain: Secondary | ICD-10-CM

## 2022-01-30 DIAGNOSIS — M62830 Muscle spasm of back: Secondary | ICD-10-CM

## 2022-01-30 NOTE — Therapy (Signed)
OUTPATIENT PHYSICAL THERAPY THORACOLUMBAR TREATMENT   Patient Name: [name] [name] MRN: [id number] DOB:[birth year], 71 y.o., male Today's Date: 01/30/2022   PT End of Session - 01/30/22 1009     Visit Number 6    Date for PT Re-Evaluation 02/27/22    Authorization Type UHC    Authorization Time Period 01/02/22 to 02/27/22    PT Start Time 0925    PT Stop Time 1005    PT Time Calculation (min) 40 min    Activity Tolerance Patient tolerated treatment well;No increased pain    Behavior During Therapy [facility] for tasks assessed/performed                Past Medical History:  Diagnosis Date   Coronary artery disease    Nonobstructive per cath in 2005 and 2006 with 50% LAD stenosis, rechecked with IVUS  during cath in 2006.   Diabetes mellitus    Hyperlipidemia    Hypertension    Obesity, morbid (HCC)    OSA (obstructive sleep apnea)    Uses CPAP   Stage 4 chronic kidney disease ([place])    Per patient   Past Surgical History:  Procedure Laterality Date   CHOLECYSTECTOMY     NASAL SINUS SURGERY     Patient Active Problem List   Diagnosis Date Noted   Chronic low back pain with bilateral sciatica 09/04/2020   Joint stiffness 09/04/2020   Diaphragmatic hernia 11/22/2019   Osteoarthritis 11/22/2019   Type II or unspecified type diabetes mellitus without mention of complication, uncontrolled 03/18/2017   Mild nonproliferative diabetic retinopathy ([place]) 03/18/2017   Nuclear sclerosis 03/18/2017   ED (erectile dysfunction) of organic origin 10/06/2016   Elevated prostate specific antigen (PSA) 10/06/2016   Male hypogonadism 10/06/2016   Stage 4 chronic kidney disease ([place]) 10/06/2016   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   IgA monoclonal gammopathy of uncertain significance 06/03/2016   CAD (coronary artery disease) 08/17/2010   Mixed hyperlipidemia 08/17/2010   Hypertension 08/17/2010   OSA (obstructive sleep apnea) 08/17/2010   Morbid obesity ([place])  08/17/2010   Esophageal reflux 06/19/2001   Hypersomnia with sleep apnea 04/30/2000   Hypertrophy of prostate without urinary obstruction and other lower urinary tract symptoms (LUTS) 04/30/1999   Proteinuria 04/30/1999    PCP: Majel Homer, PA  REFERRING PROVIDER: Ileene Rubens, MD  REFERRING DIAG: Other spondylosis with radiculopathy, lumbar region; Spinal stenosis of lumbar region with neurogenic claudication  Rationale for Evaluation and Treatment: Rehabilitation  THERAPY DIAG:  Other low back pain  Stiffness of left hip, not elsewhere classified  Muscle spasm of back  Stiffness of right hip, not elsewhere classified  ONSET DATE: 3 years ago  SUBJECTIVE:  SUBJECTIVE STATEMENT: Things are going well. Pt states he is a little stiff after driving all night. His sleep has improved since trying his HEP before and after bed.  PERTINENT HISTORY:  DM, chronic back pain   PAIN:  Are you having pain? Yes: NPRS scale: 6/10 Pain location: LBP Pain description: back and legs Aggravating factors: walking Relieving factors: rest - only with standing or walking immediate pain in the buttock region down to the knees Voltaren gel seems to help some with the pain  PRECAUTIONS: None  WEIGHT BEARING RESTRICTIONS: No  FALLS:  Has patient fallen in last 6 months? No  LIVING ENVIRONMENT: Lives with: lives with their family Lives in: House/apartment Stairs: No 6 steps at work Has following equipment at home: None  OCCUPATION: drive trucks   PLOF: Independent  PATIENT GOALS: decrease pain   NEXT MD VISIT: nothing set for now  OBJECTIVE:   DIAGNOSTIC FINDINGS:  05/22/21 IMPRESSION: Multilevel lumbar spondylosis.   Progressive disc protrusion at L2-3 with progressive moderate  spinal stenosis. Other levels are stable from 2020 MRI.  PATIENT SURVEYS:  Next visit  SCREENING FOR RED FLAGS: Bowel or bladder incontinence: No Spinal tumors: No Cauda equina syndrome: No Compression fracture: No Abdominal aneurysm: No  COGNITION: Overall cognitive status: Within functional limits for tasks assessed     SENSATION: Tingling Rt anterior thigh   MUSCLE LENGTH: Hamstrings: Right WNL deg; Left WNL deg Marcello Moores test: Right 100 deg; Left 100 deg  POSTURE:  forward trunk lean  PALPATION: Muscle spasm and tenderness in bilateral glutes  LUMBAR ROM:   AROM eval  Flexion Minimal spinal flexion noted (+) pain coming back up  Extension Unable to extend beyond neutral    Right lateral flexion   Left lateral flexion   Right rotation Limited   Left rotation (+) pain Lt back/buttock region   (Blank rows = not tested)  LOWER EXTREMITY ROM:     Active  Right eval Left eval  Hip flexion    Hip extension -5 -5  Hip abduction    Hip adduction    Hip internal rotation 10 10 (+)Rt buttock pain  Hip external rotation 20 20  Knee flexion    Knee extension    Ankle dorsiflexion    Ankle plantarflexion    Ankle inversion    Ankle eversion     (Blank rows = not tested)  LOWER EXTREMITY MMT:    MMT Right eval Left eval  Hip flexion 5 5  Hip extension 4 4  Hip abduction 4 4  Hip adduction    Hip internal rotation    Hip external rotation    Knee flexion 5 5  Knee extension 5 5  Ankle dorsiflexion 5 5  Ankle plantarflexion    Ankle inversion    Ankle eversion     (Blank rows = not tested)  LUMBAR SPECIAL TESTS:  Slump test: Negative  FUNCTIONAL TESTS:    GAIT: Distance walked: 2f Assistive device utilized: None Level of assistance: Complete Independence Comments: pain after walking 254f minimal trunk rotation   TODAY'S TREATMENT:  DATE:  01/30/22: Manual Tx CPAs L1-L5 x2 bouts 30 sec each Lt and Rt lumbar gapping in sidelying x60 sec each STM lumbosacral region Therex Seated abdominal bracing  Seated pallof press green TB x15 reps each side Seated diagonal chops with focus on exhale to prevent abdominal coning- #15 x20 each side Standing quad stretch 2x30 sec each   01/28/22 Nustep level 5 x 6 minutes Leg extension 10# 2x10 Leg curls 25# 2x10 Seated row 20# Lats 20# Black tband lumbar extension 35# triceps Ball roll outs in sitting Feet on ball K2C, trunk rotation, posterior isometric, abdominal isometrics Passive HS and piriformis stretch   01/25/22: Sidelying thoracic rotation x10 each side  Supine abdominal pressure management with breathing and verbal/tactile cuing 2x10 Supine diagonal chops with 1 UE green TB proper abdominal pressure maintained Supine diagonal chops with 2 UE green TB x5 reps Supine LE on green physioball LTR x10 Seated lat pulldowns #35 x10 reps (+) coning noted Seated TrA activation 10x5sec hold  Standing quad stretch in chair 2x30 sec    01/21/22 Bike L1 x41mns  Ball roll outs x10 Shoulder ext 10# 2x10  Cable rows 15# 2x10 Feet on pball rotations, K2C, small bridges x10 each HS stretch, knee to chest 30s  AR press 20# 2x10  blackTB crunches 2x10 Calf stretch 30s  Calf raises 2x10   PATIENT EDUCATION:  Education details: eval findings/POC Person educated: Patient Education method: Explanation Education comprehension: verbalized understanding and returned demonstration  HOME EXERCISE PROGRAM: Access Code: [email or web address]: [email or web address] Date: 01/30/2022 Prepared by: [facility] Exercises - Supine Lower Trunk Rotation  - 1 x daily - 7 x weekly - 1 sets - 10 reps - Sidelying Thoracic Lumbar Rotation  - 1 x daily - 7 x weekly - 2 sets - 10 reps - Standing Quad Stretch with Table and  Chair Support  - 2 x daily - 7 x weekly - 3 sets -  seconds hold - Seated Anti-Rotation Press With Anchored Resistance  - 1 x daily - 7 x weekly - 2 sets - 20 reps  ASSESSMENT:  CLINICAL IMPRESSION: Pt still has LE stiffness and discomfort with standing/walking following his long driving shifts. He feels that his back pain is atleast 50% improved from the start of PT. PT performed manual treatment to pt's lumbar spine to improve mobility in the area. Pt was able to better manage abdominal pressure during seated exercises with verbal cues for proper breathing. Pt's HEP was updated to reflect progressions made today.   OBJECTIVE IMPAIRMENTS: Abnormal gait, decreased activity tolerance, decreased mobility, difficulty walking, decreased ROM, decreased strength, hypomobility, increased muscle spasms, impaired flexibility, improper body mechanics, postural dysfunction, obesity, and pain.   ACTIVITY LIMITATIONS: lifting, bending, standing, sleeping, bed mobility, and locomotion level  PARTICIPATION LIMITATIONS: cleaning, laundry, interpersonal relationship, community activity, yard work, and walking  PERSONAL FACTORS: Age, Fitness, Past/current experiences, Profession, Time since onset of injury/illness/exacerbation, and 1 comorbidity: DM  are also affecting patient's functional outcome.   REHAB POTENTIAL: Good  CLINICAL DECISION MAKING: Stable/uncomplicated  EVALUATION COMPLEXITY: Low   GOALS: Goals reviewed with patient? Yes  SHORT TERM GOALS: Target date: 02/04/22  Patient will be independent with his HEP Baseline: Goal status: met   LONG TERM GOALS: Target date: 03/25/22  Pt will have improved lumbar active ROM with 50% improvement in pain in all directions.  Baseline:  Goal status: progressing  2.  Pt will be able to complete  roll and bed mobility without coning compensation throughout his session. Baseline:  Goal status: progressing  3.  Pt will report atleast 50% improvement  in his LBP from the start of PT. Baseline:  Goal status: MET  4.  Pt will have 5/5 MMT strength of BLE. Baseline:  Goal status: INITIAL   PLAN:  PT FREQUENCY: 2x/week  PT DURATION: 8 weeks  PLANNED INTERVENTIONS: Therapeutic exercises, Therapeutic activity, Neuromuscular re-education, Balance training, Gait training, Patient/Family education, Self Care, Joint mobilization, Joint manipulation, Aquatic Therapy, Dry Needling, Spinal mobilization, Cryotherapy, Moist heat, Manual therapy, and Re-evaluation.  PLAN FOR NEXT SESSION: abdominal strength in standing, hip abd/ext strength, hip flexibility  10:10 AM,01/30/22 Sherol Dade PT, DPT [place] [facility] [place]

## 2022-02-04 ENCOUNTER — Encounter: Payer: Self-pay | Admitting: Physical Therapy

## 2022-02-04 ENCOUNTER — Ambulatory Visit: Payer: 59 | Admitting: Physical Therapy

## 2022-02-04 DIAGNOSIS — M25652 Stiffness of left hip, not elsewhere classified: Secondary | ICD-10-CM

## 2022-02-04 DIAGNOSIS — M25651 Stiffness of right hip, not elsewhere classified: Secondary | ICD-10-CM

## 2022-02-04 DIAGNOSIS — M5459 Other low back pain: Secondary | ICD-10-CM

## 2022-02-04 DIAGNOSIS — M62830 Muscle spasm of back: Secondary | ICD-10-CM

## 2022-02-04 NOTE — Therapy (Signed)
OUTPATIENT PHYSICAL THERAPY THORACOLUMBAR TREATMENT   Patient Name: [name] [name] MRN: [id number] DOB:Mar 06, 1950, 71 y.o., male Today's Date: 02/04/2022   PT End of Session - 02/04/22 0925     Visit Number 7    Date for PT Re-Evaluation 02/27/22    Authorization Type U[employer]    PT Start Time [phone]    PT Stop Time 1010    PT Time Calculation (min) 46 min    Activity Tolerance Patient tolerated treatment well;No increased pain    Behavior During Therapy [facility] for tasks assessed/performed                Past Medical History:  Diagnosis Date   Coronary artery disease    Nonobstructive per cath in 2005 and 2006 with 50% LAD stenosis, rechecked with IVUS  during cath in 2006.   Diabetes mellitus    Hyperlipidemia    Hypertension    Obesity, morbid (HCC)    OSA (obstructive sleep apnea)    Uses CPAP   Stage 4 chronic kidney disease ([place])    Per patient   Past Surgical History:  Procedure Laterality Date   CHOLECYSTECTOMY     NASAL SINUS SURGERY     Patient Active Problem List   Diagnosis Date Noted   Chronic low back pain with bilateral sciatica 09/04/2020   Joint stiffness 09/04/2020   Diaphragmatic hernia 11/22/2019   Osteoarthritis 11/22/2019   Type II or unspecified type diabetes mellitus without mention of complication, uncontrolled 03/18/2017   Mild nonproliferative diabetic retinopathy ([place]) 03/18/2017   Nuclear sclerosis 03/18/2017   ED (erectile dysfunction) of organic origin 10/06/2016   Elevated prostate specific antigen (PSA) 10/06/2016   Male hypogonadism 10/06/2016   Stage 4 chronic kidney disease ([place]) 10/06/2016   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   IgA monoclonal gammopathy of uncertain significance 06/03/2016   CAD (coronary artery disease) 08/17/2010   Mixed hyperlipidemia 08/17/2010   Hypertension 08/17/2010   OSA (obstructive sleep apnea) 08/17/2010   Morbid obesity ([place]) 08/17/2010   Esophageal reflux 06/19/2001    Hypersomnia with sleep apnea 04/30/2000   Hypertrophy of prostate without urinary obstruction and other lower urinary tract symptoms (LUTS) 04/30/1999   Proteinuria 04/30/1999    PCP: Majel Homer, PA  REFERRING PROVIDER: Ileene Rubens, MD  REFERRING DIAG: Other spondylosis with radiculopathy, lumbar region; Spinal stenosis of lumbar region with neurogenic claudication  Rationale for Evaluation and Treatment: Rehabilitation  THERAPY DIAG:  Other low back pain  Stiffness of left hip, not elsewhere classified  Muscle spasm of back  Stiffness of right hip, not elsewhere classified  ONSET DATE: 3 years ago  SUBJECTIVE:  SUBJECTIVE STATEMENT: Pretty good, not bad, knees are hurting a little more  PERTINENT HISTORY:  DM, chronic back pain   PAIN:  Are you having pain? Yes: NPRS scale: 5/10 Pain location: LBP Pain description: back and legs Aggravating factors: walking Relieving factors: rest - only with standing or walking immediate pain in the buttock region down to the knees Voltaren gel seems to help some with the pain  PRECAUTIONS: None  WEIGHT BEARING RESTRICTIONS: No  FALLS:  Has patient fallen in last 6 months? No  LIVING ENVIRONMENT: Lives with: lives with their family Lives in: House/apartment Stairs: No 6 steps at work Has following equipment at home: None  OCCUPATION: drive trucks   PLOF: Independent  PATIENT GOALS: decrease pain   NEXT MD VISIT: nothing set for now  OBJECTIVE:   DIAGNOSTIC FINDINGS:  05/22/21 IMPRESSION: Multilevel lumbar spondylosis.   Progressive disc protrusion at L2-3 with progressive moderate spinal stenosis. Other levels are stable from 2020 MRI.  PATIENT SURVEYS:  Next visit  SCREENING FOR RED FLAGS: Bowel or bladder incontinence:  No Spinal tumors: No Cauda equina syndrome: No Compression fracture: No Abdominal aneurysm: No  COGNITION: Overall cognitive status: Within functional limits for tasks assessed     SENSATION: Tingling Rt anterior thigh   MUSCLE LENGTH: Hamstrings: Right WNL deg; Left WNL deg Marcello Moores test: Right 100 deg; Left 100 deg  POSTURE:  forward trunk lean  PALPATION: Muscle spasm and tenderness in bilateral glutes  LUMBAR ROM:   AROM eval  Flexion Minimal spinal flexion noted (+) pain coming back up  Extension Unable to extend beyond neutral    Right lateral flexion   Left lateral flexion   Right rotation Limited   Left rotation (+) pain Lt back/buttock region   (Blank rows = not tested)  LOWER EXTREMITY ROM:     Active  Right eval Left eval  Hip flexion    Hip extension -5 -5  Hip abduction    Hip adduction    Hip internal rotation 10 10 (+)Rt buttock pain  Hip external rotation 20 20  Knee flexion    Knee extension    Ankle dorsiflexion    Ankle plantarflexion    Ankle inversion    Ankle eversion     (Blank rows = not tested)  LOWER EXTREMITY MMT:    MMT Right eval Left eval  Hip flexion 5 5  Hip extension 4 4  Hip abduction 4 4  Hip adduction    Hip internal rotation    Hip external rotation    Knee flexion 5 5  Knee extension 5 5  Ankle dorsiflexion 5 5  Ankle plantarflexion    Ankle inversion    Ankle eversion     (Blank rows = not tested)  LUMBAR SPECIAL TESTS:  Slump test: Negative  FUNCTIONAL TESTS:    GAIT: Distance walked: 32f Assistive device utilized: None Level of assistance: Complete Independence Comments: pain after walking 245f minimal trunk rotation   TODAY'S TREATMENT:  DATE:  02/04/22 Nustep level 5 x 6 minutes Leg curls 25# Leg extension 10# Seated row 25# Lat pulls 25# Black tband lumbar  extension 35# pull down Feet on ball K2C, trunk rotation, small bridges, isometric abs Passive HS, piriformis and trunk rotation stretches  01/30/22: Manual Tx CPAs L1-L5 x2 bouts 30 sec each Lt and Rt lumbar gapping in sidelying x60 sec each STM lumbosacral region Therex Seated abdominal bracing  Seated pallof press green TB x15 reps each side Seated diagonal chops with focus on exhale to prevent abdominal coning- #15 x20 each side Standing quad stretch 2x30 sec each   01/28/22 Nustep level 5 x 6 minutes Leg extension 10# 2x10 Leg curls 25# 2x10 Seated row 20# Lats 20# Black tband lumbar extension 35# triceps Ball roll outs in sitting Feet on ball K2C, trunk rotation, posterior isometric, abdominal isometrics Passive HS and piriformis stretch   01/25/22: Sidelying thoracic rotation x10 each side  Supine abdominal pressure management with breathing and verbal/tactile cuing 2x10 Supine diagonal chops with 1 UE green TB proper abdominal pressure maintained Supine diagonal chops with 2 UE green TB x5 reps Supine LE on green physioball LTR x10 Seated lat pulldowns #35 x10 reps (+) coning noted Seated TrA activation 10x5sec hold  Standing quad stretch in chair 2x30 sec    01/21/22 Bike L1 x63mns  Ball roll outs x10 Shoulder ext 10# 2x10  Cable rows 15# 2x10 Feet on pball rotations, K2C, small bridges x10 each HS stretch, knee to chest 30s  AR press 20# 2x10  blackTB crunches 2x10 Calf stretch 30s  Calf raises 2x10   PATIENT EDUCATION:  Education details: eval findings/POC Person educated: Patient Education method: Explanation Education comprehension: verbalized understanding and returned demonstration  HOME EXERCISE PROGRAM: Access Code: [email or web address]: [email or web address] Date: 01/30/2022 Prepared by: [facility] Exercises - Supine Lower Trunk Rotation  - 1 x daily - 7 x weekly - 1 sets - 10  reps - Sidelying Thoracic Lumbar Rotation  - 1 x daily - 7 x weekly - 2 sets - 10 reps - Standing Quad Stretch with Table and Chair Support  - 2 x daily - 7 x weekly - 3 sets -  seconds hold - Seated Anti-Rotation Press With Anchored Resistance  - 1 x daily - 7 x weekly - 2 sets - 20 reps  ASSESSMENT:  CLINICAL IMPRESSION: Pt overall is reporting improvements in function and less pain, he is still stiff in the trunk and the LE's.  I added the resisted back extension and he reported that this was where his pain was  OBJECTIVE IMPAIRMENTS: Abnormal gait, decreased activity tolerance, decreased mobility, difficulty walking, decreased ROM, decreased strength, hypomobility, increased muscle spasms, impaired flexibility, improper body mechanics, postural dysfunction, obesity, and pain.   ACTIVITY LIMITATIONS: lifting, bending, standing, sleeping, bed mobility, and locomotion level  PARTICIPATION LIMITATIONS: cleaning, laundry, interpersonal relationship, community activity, yard work, and walking  PERSONAL FACTORS: Age, Fitness, Past/current experiences, Profession, Time since onset of injury/illness/exacerbation, and 1 comorbidity: DM  are also affecting patient's functional outcome.   REHAB POTENTIAL: Good  CLINICAL DECISION MAKING: Stable/uncomplicated  EVALUATION COMPLEXITY: Low   GOALS: Goals reviewed with patient? Yes  SHORT TERM GOALS: Target date: 02/04/22  Patient will be independent with his HEP Baseline: Goal status: met   LONG TERM GOALS: Target date: 03/25/22  Pt will have improved lumbar active ROM with 50% improvement in pain in all directions.  Baseline:  Goal status: progressing  2.  Pt will be able to complete roll and bed mobility without coning compensation throughout his session. Baseline:  Goal status: progressing  3.  Pt will report atleast 50% improvement in his LBP from the start of PT. Baseline:  Goal status: MET  4.  Pt will have 5/5 MMT strength  of BLE. Baseline:  Goal status: progressing   PLAN:  PT FREQUENCY: 2x/week  PT DURATION: 8 weeks  PLANNED INTERVENTIONS: Therapeutic exercises, Therapeutic activity, Neuromuscular re-education, Balance training, Gait training, Patient/Family education, Self Care, Joint mobilization, Joint manipulation, Aquatic Therapy, Dry Needling, Spinal mobilization, Cryotherapy, Moist heat, Manual therapy, and Re-evaluation.  PLAN FOR NEXT SESSION: abdominal strength in standing, hip abd/ext strength, hip flexibility  9:25 AM,02/04/22 Lum Babe,  PT

## 2022-02-05 NOTE — Therapy (Unsigned)
OUTPATIENT PHYSICAL THERAPY THORACOLUMBAR TREATMENT   Patient Name: [name] [name] MRN: [id number] DOB:01-Dec-1950, 71 y.o., male Today's Date: 02/06/2022   PT End of Session - 02/06/22 1011     Visit Number 8    Date for PT Re-Evaluation 02/27/22    Authorization Type U[employer]    PT Start Time 0930    PT Stop Time 1011    PT Time Calculation (min) 41 min    Activity Tolerance Patient tolerated treatment well;No increased pain    Behavior During Therapy [facility] for tasks assessed/performed                 Past Medical History:  Diagnosis Date   Coronary artery disease    Nonobstructive per cath in 2005 and 2006 with 50% LAD stenosis, rechecked with IVUS  during cath in 2006.   Diabetes mellitus    Hyperlipidemia    Hypertension    Obesity, morbid (HCC)    OSA (obstructive sleep apnea)    Uses CPAP   Stage 4 chronic kidney disease ([place])    Per patient   Past Surgical History:  Procedure Laterality Date   CHOLECYSTECTOMY     NASAL SINUS SURGERY     Patient Active Problem List   Diagnosis Date Noted   Chronic low back pain with bilateral sciatica 09/04/2020   Joint stiffness 09/04/2020   Diaphragmatic hernia 11/22/2019   Osteoarthritis 11/22/2019   Type II or unspecified type diabetes mellitus without mention of complication, uncontrolled 03/18/2017   Mild nonproliferative diabetic retinopathy ([place]) 03/18/2017   Nuclear sclerosis 03/18/2017   ED (erectile dysfunction) of organic origin 10/06/2016   Elevated prostate specific antigen (PSA) 10/06/2016   Male hypogonadism 10/06/2016   Stage 4 chronic kidney disease ([place]) 10/06/2016   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   IgA monoclonal gammopathy of uncertain significance 06/03/2016   CAD (coronary artery disease) 08/17/2010   Mixed hyperlipidemia 08/17/2010   Hypertension 08/17/2010   OSA (obstructive sleep apnea) 08/17/2010   Morbid obesity ([place]) 08/17/2010   Esophageal reflux 06/19/2001    Hypersomnia with sleep apnea 04/30/2000   Hypertrophy of prostate without urinary obstruction and other lower urinary tract symptoms (LUTS) 04/30/1999   Proteinuria 04/30/1999    PCP: Majel Homer, PA  REFERRING PROVIDER: Ileene Rubens, MD  REFERRING DIAG: Other spondylosis with radiculopathy, lumbar region; Spinal stenosis of lumbar region with neurogenic claudication  Rationale for Evaluation and Treatment: Rehabilitation  THERAPY DIAG:  Other low back pain  Stiffness of left hip, not elsewhere classified  Muscle spasm of back  Stiffness of right hip, not elsewhere classified  ONSET DATE: 3 years ago  SUBJECTIVE:  SUBJECTIVE STATEMENT: Pt states things are going pretty good.  PERTINENT HISTORY:  DM, chronic back pain   PAIN:  Are you having pain? Yes: NPRS scale: 5 or 6/10 Pain location: upper buttocks and legs Pain description: back and legs Aggravating factors: walking Relieving factors: rest - only with standing or walking immediate pain in the buttock region down to the knees Voltaren gel seems to help some with the pain  PRECAUTIONS: None  WEIGHT BEARING RESTRICTIONS: No  FALLS:  Has patient fallen in last 6 months? No  LIVING ENVIRONMENT: Lives with: lives with their family Lives in: House/apartment Stairs: No 6 steps at work Has following equipment at home: None  OCCUPATION: drive trucks   PLOF: Independent  PATIENT GOALS: decrease pain   NEXT MD VISIT: nothing set for now  OBJECTIVE:   DIAGNOSTIC FINDINGS:  05/22/21 IMPRESSION: Multilevel lumbar spondylosis.   Progressive disc protrusion at L2-3 with progressive moderate spinal stenosis. Other levels are stable from 2020 MRI.  PATIENT SURVEYS:  Next visit  SCREENING FOR RED FLAGS: Bowel or bladder  incontinence: No Spinal tumors: No Cauda equina syndrome: No Compression fracture: No Abdominal aneurysm: No  COGNITION: Overall cognitive status: Within functional limits for tasks assessed     SENSATION: Tingling Rt anterior thigh   MUSCLE LENGTH: Hamstrings: Right WNL deg; Left WNL deg Marcello Moores test: Right 100 deg; Left 100 deg  POSTURE:  forward trunk lean  PALPATION: Muscle spasm and tenderness in bilateral glutes  LUMBAR ROM:   AROM eval 02/06/22  Flexion Minimal spinal flexion noted (+) pain coming back up Minimal spinal flexion noted (-) pain coming back up  Extension Unable to extend beyond neutral   (+) pain increase with extension x10 in standing  Right lateral flexion    Left lateral flexion    Right rotation (+) pulling Lt buttock  same  Left rotation (+) pulling Rt buttock same   (Blank rows = not tested)  LOWER EXTREMITY ROM:     Active  Right eval Left eval  Hip flexion    Hip extension -5 -5  Hip abduction    Hip adduction    Hip internal rotation 10 10 (+)Rt buttock pain  Hip external rotation 20 20  Knee flexion    Knee extension    Ankle dorsiflexion    Ankle plantarflexion    Ankle inversion    Ankle eversion     (Blank rows = not tested)  LOWER EXTREMITY MMT:    MMT Right eval Left eval  Hip flexion 5 5  Hip extension 4 4  Hip abduction 4 4  Hip adduction    Hip internal rotation    Hip external rotation    Knee flexion 5 5  Knee extension 5 5  Ankle dorsiflexion 5 5  Ankle plantarflexion    Ankle inversion    Ankle eversion     (Blank rows = not tested)  LUMBAR SPECIAL TESTS:  Slump test: Negative  FUNCTIONAL TESTS:    GAIT: Distance walked: 80f Assistive device utilized: None Level of assistance: Complete Independence Comments: pain after walking 246f minimal trunk rotation   TODAY'S TREATMENT:  DATE:  02/06/22 Manual Tx Supine therapy ball stretch with PT assisting overpressure B knees to chest, low trunk rotation x20 reps PROM Lt glute med and piriformis stretch 5x10 sec hold each side Therex Lat pulldown #35 x15 reps, #45 x10 reps  Seated lumbar extension black TB x15 reps  Seated diagonal chops #25 2x10 reps each side  Standing lumbar extension with towel  Seated upper lumbar extension over half foam roll x10 Seated trunk flexion with and without therapy ball x15  02/04/22 Nustep level 5 x 6 minutes Leg curls 25# Leg extension 10# Seated row 25# Lat pulls 25# Black tband lumbar extension 35# pull down Feet on ball K2C, trunk rotation, small bridges, isometric abs Passive HS, piriformis and trunk rotation stretches  01/30/22: Manual Tx CPAs L1-L5 x2 bouts 30 sec each Lt and Rt lumbar gapping in sidelying x60 sec each STM lumbosacral region Therex Seated abdominal bracing  Seated pallof press green TB x15 reps each side Seated diagonal chops with focus on exhale to prevent abdominal coning- #15 x20 each side Standing quad stretch 2x30 sec each   01/28/22 Nustep level 5 x 6 minutes Leg extension 10# 2x10 Leg curls 25# 2x10 Seated row 20# Lats 20# Black tband lumbar extension 35# triceps Ball roll outs in sitting Feet on ball K2C, trunk rotation, posterior isometric, abdominal isometrics Passive HS and piriformis stretch   01/25/22: Sidelying thoracic rotation x10 each side  Supine abdominal pressure management with breathing and verbal/tactile cuing 2x10 Supine diagonal chops with 1 UE green TB proper abdominal pressure maintained Supine diagonal chops with 2 UE green TB x5 reps Supine LE on green physioball LTR x10 Seated lat pulldowns #35 x10 reps (+) coning noted Seated TrA activation 10x5sec hold  Standing quad stretch in chair 2x30 sec     PATIENT EDUCATION:  Education details: eval findings/POC Person educated: Patient Education  method: Explanation Education comprehension: verbalized understanding and returned demonstration  HOME EXERCISE PROGRAM: Access Code: [email or web address] URL: [email or web address] Date: 02/06/2022 Prepared by: [facility]  Exercises - Supine Lower Trunk Rotation  - 1 x daily - 7 x weekly - 1 sets - 10 reps - Sidelying Thoracic Lumbar Rotation  - 1 x daily - 7 x weekly - 2 sets - 10 reps - Standing Quad Stretch with Table and Chair Support  - 2 x daily - 7 x weekly - 3 sets -  seconds hold - Seated Anti-Rotation Press With Anchored Resistance  - 1 x daily - 7 x weekly - 2 sets - 20 reps - Seated Diagonal Chops with Resistance  - 1 x daily - 7 x weekly - 3 sets - 10 reps  ASSESSMENT:  CLINICAL IMPRESSION: Pt continues to report compliance with his HEP. Session focused on progressing trunk strength and hip/lumbar flexibility. Pt was able to complete strengthening exercises with progressions in resistance. Pt's active lumbar flexion has improved without reported pain, however segmental mobility is limited. Pt required PT cuing to avoid hinging during lumbar spine mobility in sitting. End of session he reported no increase in pain.   OBJECTIVE IMPAIRMENTS: Abnormal gait, decreased activity tolerance, decreased mobility, difficulty walking, decreased ROM, decreased strength, hypomobility, increased muscle spasms, impaired flexibility, improper body mechanics, postural dysfunction, obesity, and pain.   ACTIVITY LIMITATIONS: lifting, bending, standing, sleeping, bed mobility, and locomotion level  PARTICIPATION LIMITATIONS: cleaning, laundry, interpersonal relationship, community activity, yard work, and walking  PERSONAL FACTORS: Age, Fitness, Past/current experiences, Profession, Time since  onset of injury/illness/exacerbation, and 1 comorbidity: DM  are also affecting patient's functional outcome.   REHAB POTENTIAL: Good  CLINICAL DECISION  MAKING: Stable/uncomplicated  EVALUATION COMPLEXITY: Low   GOALS: Goals reviewed with patient? Yes  SHORT TERM GOALS: Target date: 02/04/22  Patient will be independent with his HEP Baseline: Goal status: met   LONG TERM GOALS: Target date: 03/25/22  Pt will have improved lumbar active ROM with 50% improvement in pain in all directions.  Baseline:  Goal status: progressing  2.  Pt will be able to complete roll and bed mobility without coning compensation throughout his session. Baseline:  Goal status: progressing  3.  Pt will report atleast 50% improvement in his LBP from the start of PT. Baseline:  Goal status: MET  4.  Pt will have 5/5 MMT strength of BLE. Baseline:  Goal status: progressing   PLAN:  PT FREQUENCY: 2x/week  PT DURATION: 8 weeks  PLANNED INTERVENTIONS: Therapeutic exercises, Therapeutic activity, Neuromuscular re-education, Balance training, Gait training, Patient/Family education, Self Care, Joint mobilization, Joint manipulation, Aquatic Therapy, Dry Needling, Spinal mobilization, Cryotherapy, Moist heat, Manual therapy, and Re-evaluation.  PLAN FOR NEXT SESSION: abdominal strength in standing, hip abd/ext strength, hip flexibility   10:16 AM,02/06/22 Sherol Dade PT, DPT [place] [facility]

## 2022-02-06 ENCOUNTER — Ambulatory Visit: Payer: 59 | Admitting: Physical Therapy

## 2022-02-06 DIAGNOSIS — M25652 Stiffness of left hip, not elsewhere classified: Secondary | ICD-10-CM

## 2022-02-06 DIAGNOSIS — M25651 Stiffness of right hip, not elsewhere classified: Secondary | ICD-10-CM

## 2022-02-06 DIAGNOSIS — M5459 Other low back pain: Secondary | ICD-10-CM

## 2022-02-06 DIAGNOSIS — M62830 Muscle spasm of back: Secondary | ICD-10-CM

## 2022-02-07 NOTE — Therapy (Signed)
OUTPATIENT PHYSICAL THERAPY THORACOLUMBAR TREATMENT   Patient Name: [name] [name] MRN: [id number] DOB:18-Apr-1950, 71 y.o., male Today's Date: 02/11/2022   PT End of Session - 02/11/22 0920     Visit Number 9    Date for PT Re-Evaluation 02/27/22    Authorization Type UHC    PT Start Time 0920    PT Stop Time 1005    PT Time Calculation (min) 45 min    Activity Tolerance Patient tolerated treatment well;No increased pain    Behavior During Therapy [facility] for tasks assessed/performed                  Past Medical History:  Diagnosis Date   Coronary artery disease    Nonobstructive per cath in 2005 and 2006 with 50% LAD stenosis, rechecked with IVUS  during cath in 2006.   Diabetes mellitus    Hyperlipidemia    Hypertension    Obesity, morbid (HCC)    OSA (obstructive sleep apnea)    Uses CPAP   Stage 4 chronic kidney disease ([place])    Per patient   Past Surgical History:  Procedure Laterality Date   CHOLECYSTECTOMY     NASAL SINUS SURGERY     Patient Active Problem List   Diagnosis Date Noted   Chronic low back pain with bilateral sciatica 09/04/2020   Joint stiffness 09/04/2020   Diaphragmatic hernia 11/22/2019   Osteoarthritis 11/22/2019   Type II or unspecified type diabetes mellitus without mention of complication, uncontrolled 03/18/2017   Mild nonproliferative diabetic retinopathy ([place]) 03/18/2017   Nuclear sclerosis 03/18/2017   ED (erectile dysfunction) of organic origin 10/06/2016   Elevated prostate specific antigen (PSA) 10/06/2016   Male hypogonadism 10/06/2016   Stage 4 chronic kidney disease ([place]) 10/06/2016   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   IgA monoclonal gammopathy of uncertain significance 06/03/2016   CAD (coronary artery disease) 08/17/2010   Mixed hyperlipidemia 08/17/2010   Hypertension 08/17/2010   OSA (obstructive sleep apnea) 08/17/2010   Morbid obesity ([place]) 08/17/2010   Esophageal reflux 06/19/2001    Hypersomnia with sleep apnea 04/30/2000   Hypertrophy of prostate without urinary obstruction and other lower urinary tract symptoms (LUTS) 04/30/1999   Proteinuria 04/30/1999    PCP: Majel Homer, PA  REFERRING PROVIDER: Ileene Rubens, MD  REFERRING DIAG: Other spondylosis with radiculopathy, lumbar region; Spinal stenosis of lumbar region with neurogenic claudication  Rationale for Evaluation and Treatment: Rehabilitation  THERAPY DIAG:  Other low back pain  Stiffness of left hip, not elsewhere classified  Muscle spasm of back  Stiffness of right hip, not elsewhere classified  ONSET DATE: 3 years ago  SUBJECTIVE:  SUBJECTIVE STATEMENT: Everything is the same. The pain is bad today its about an 8.   PERTINENT HISTORY:  DM, chronic back pain   PAIN:  Are you having pain? Yes: NPRS scale: 8/10 Pain location: upper buttocks and legs Pain description: back and legs Aggravating factors: walking Relieving factors: rest - only with standing or walking immediate pain in the buttock region down to the knees Voltaren gel seems to help some with the pain  PRECAUTIONS: None  WEIGHT BEARING RESTRICTIONS: No  FALLS:  Has patient fallen in last 6 months? No  LIVING ENVIRONMENT: Lives with: lives with their family Lives in: House/apartment Stairs: No 6 steps at work Has following equipment at home: None  OCCUPATION: drive trucks   PLOF: Independent  PATIENT GOALS: decrease pain   NEXT MD VISIT: nothing set for now  OBJECTIVE:   DIAGNOSTIC FINDINGS:  05/22/21 IMPRESSION: Multilevel lumbar spondylosis.   Progressive disc protrusion at L2-3 with progressive moderate spinal stenosis. Other levels are stable from 2020 MRI.  PATIENT SURVEYS:  Next visit  SCREENING FOR RED  FLAGS: Bowel or bladder incontinence: No Spinal tumors: No Cauda equina syndrome: No Compression fracture: No Abdominal aneurysm: No  COGNITION: Overall cognitive status: Within functional limits for tasks assessed     SENSATION: Tingling Rt anterior thigh   MUSCLE LENGTH: Hamstrings: Right WNL deg; Left WNL deg Marcello Moores test: Right 100 deg; Left 100 deg  POSTURE:  forward trunk lean  PALPATION: Muscle spasm and tenderness in bilateral glutes  LUMBAR ROM:   AROM eval 02/06/22  Flexion Minimal spinal flexion noted (+) pain coming back up Minimal spinal flexion noted (-) pain coming back up  Extension Unable to extend beyond neutral   (+) pain increase with extension x10 in standing  Right lateral flexion    Left lateral flexion    Right rotation (+) pulling Lt buttock  same  Left rotation (+) pulling Rt buttock same   (Blank rows = not tested)  LOWER EXTREMITY ROM:     Active  Right eval Left eval  Hip flexion    Hip extension -5 -5  Hip abduction    Hip adduction    Hip internal rotation 10 10 (+)Rt buttock pain  Hip external rotation 20 20  Knee flexion    Knee extension    Ankle dorsiflexion    Ankle plantarflexion    Ankle inversion    Ankle eversion     (Blank rows = not tested)  LOWER EXTREMITY MMT:    MMT Right eval Left eval  Hip flexion 5 5  Hip extension 4 4  Hip abduction 4 4  Hip adduction    Hip internal rotation    Hip external rotation    Knee flexion 5 5  Knee extension 5 5  Ankle dorsiflexion 5 5  Ankle plantarflexion    Ankle inversion    Ankle eversion     (Blank rows = not tested)  LUMBAR SPECIAL TESTS:  Slump test: Negative  FUNCTIONAL TESTS:    GAIT: Distance walked: 48f Assistive device utilized: None Level of assistance: Complete Independence Comments: pain after walking 29f minimal trunk rotation   TODAY'S TREATMENT:  DATE:  02/11/22 NuStep L4 x74mns  Stretching HS, knees to chest, trunk rotations Bridges 2x10 Leg ext 10# 2x10 HS curls 35# 2x10 Leg press 60# 2x10 AR press 20# 2x10 STS with ball push out yellow 2x5   02/06/22 Manual Tx Supine therapy ball stretch with PT assisting overpressure B knees to chest, low trunk rotation x20 reps PROM Lt glute med and piriformis stretch 5x10 sec hold each side Therex Lat pulldown #35 x15 reps, #45 x10 reps  Seated lumbar extension black TB x15 reps  Seated diagonal chops #25 2x10 reps each side  Standing lumbar extension with towel  Seated upper lumbar extension over half foam roll x10 Seated trunk flexion with and without therapy ball x15  02/04/22 Nustep level 5 x 6 minutes Leg curls 25# Leg extension 10# Seated row 25# Lat pulls 25# Black tband lumbar extension 35# pull down Feet on ball K2C, trunk rotation, small bridges, isometric abs Passive HS, piriformis and trunk rotation stretches  01/30/22: Manual Tx CPAs L1-L5 x2 bouts 30 sec each Lt and Rt lumbar gapping in sidelying x60 sec each STM lumbosacral region Therex Seated abdominal bracing  Seated pallof press green TB x15 reps each side Seated diagonal chops with focus on exhale to prevent abdominal coning- #15 x20 each side Standing quad stretch 2x30 sec each   01/28/22 Nustep level 5 x 6 minutes Leg extension 10# 2x10 Leg curls 25# 2x10 Seated row 20# Lats 20# Black tband lumbar extension 35# triceps Ball roll outs in sitting Feet on ball K2C, trunk rotation, posterior isometric, abdominal isometrics Passive HS and piriformis stretch   01/25/22: Sidelying thoracic rotation x10 each side  Supine abdominal pressure management with breathing and verbal/tactile cuing 2x10 Supine diagonal chops with 1 UE green TB proper abdominal pressure maintained Supine diagonal chops with 2 UE green TB x5 reps Supine LE on green physioball LTR x10 Seated  lat pulldowns #35 x10 reps (+) coning noted Seated TrA activation 10x5sec hold  Standing quad stretch in chair 2x30 sec     PATIENT EDUCATION:  Education details: eval findings/POC Person educated: Patient Education method: Explanation Education comprehension: verbalized understanding and returned demonstration  HOME EXERCISE PROGRAM: Access Code: [email or web address]: [email or web address] Date: 02/06/2022 [facility]ared by: [facility] Exercises - Supine Lower Trunk Rotation  - 1 x daily - 7 x weekly - 1 sets - 10 reps - Sidelying Thoracic Lumbar Rotation  - 1 x daily - 7 x weekly - 2 sets - 10 reps - Standing Quad Stretch with Table and Chair Support  - 2 x daily - 7 x weekly - 3 sets -  seconds hold - Seated Anti-Rotation Press With Anchored Resistance  - 1 x daily - 7 x weekly - 2 sets - 20 reps - Seated Diagonal Chops with Resistance  - 1 x daily - 7 x weekly - 3 sets - 10 reps  ASSESSMENT:  CLINICAL IMPRESSION: Pt continues to have pain in low back and c/o of soreness. We focused on some lumbar mobility and LE and core strengthening today. States that he feels better after exercising. Fatigues at end of session with STS.   OBJECTIVE IMPAIRMENTS: Abnormal gait, decreased activity tolerance, decreased mobility, difficulty walking, decreased ROM, decreased strength, hypomobility, increased muscle spasms, impaired flexibility, improper body mechanics, postural dysfunction, obesity, and pain.   ACTIVITY LIMITATIONS: lifting, bending, standing, sleeping, bed mobility, and locomotion level  PARTICIPATION LIMITATIONS: cleaning, laundry, interpersonal relationship, community activity, yard  work, and walking  PERSONAL FACTORS: Age, Fitness, Past/current experiences, Profession, Time since onset of injury/illness/exacerbation, and 1 comorbidity: DM  are also affecting patient's functional outcome.   REHAB POTENTIAL: Good  CLINICAL  DECISION MAKING: Stable/uncomplicated  EVALUATION COMPLEXITY: Low   GOALS: Goals reviewed with patient? Yes  SHORT TERM GOALS: Target date: 02/04/22  Patient will be independent with his HEP Baseline: Goal status: met   LONG TERM GOALS: Target date: 03/25/22  Pt will have improved lumbar active ROM with 50% improvement in pain in all directions.  Baseline:  Goal status: progressing  2.  Pt will be able to complete roll and bed mobility without coning compensation throughout his session. Baseline:  Goal status: progressing  3.  Pt will report atleast 50% improvement in his LBP from the start of PT. Baseline:  Goal status: MET  4.  Pt will have 5/5 MMT strength of BLE. Baseline:  Goal status: progressing   PLAN:  PT FREQUENCY: 2x/week  PT DURATION: 8 weeks  PLANNED INTERVENTIONS: Therapeutic exercises, Therapeutic activity, Neuromuscular re-education, Balance training, Gait training, Patient/Family education, Self Care, Joint mobilization, Joint manipulation, Aquatic Therapy, Dry Needling, Spinal mobilization, Cryotherapy, Moist heat, Manual therapy, and Re-evaluation.  PLAN FOR NEXT SESSION: progress note, maybe e-stim   10:00 AM,02/11/22 Andris Baumann PT, [place] at [place]

## 2022-02-11 ENCOUNTER — Ambulatory Visit: Payer: 59

## 2022-02-11 DIAGNOSIS — M5459 Other low back pain: Secondary | ICD-10-CM | POA: Diagnosis not present

## 2022-02-11 DIAGNOSIS — M25652 Stiffness of left hip, not elsewhere classified: Secondary | ICD-10-CM

## 2022-02-11 DIAGNOSIS — M25651 Stiffness of right hip, not elsewhere classified: Secondary | ICD-10-CM

## 2022-02-11 DIAGNOSIS — M62830 Muscle spasm of back: Secondary | ICD-10-CM

## 2022-02-12 NOTE — Therapy (Signed)
OUTPATIENT PHYSICAL THERAPY THORACOLUMBAR TREATMENT  Progress Note Reporting Period 01/02/22 to 02/13/22  See note below for Objective Data and Assessment of Progress/Goals.      Patient Name: [name] [name] MRN: [id number] DOB:05-28-1950, 71 y.o., male Today's Date: 02/13/2022   PT End of Session - 02/13/22 0922     Visit Number 10    Date for PT Re-Evaluation 02/27/22    Authorization Type UHC    PT Start Time 0921    PT Stop Time 1010    PT Time Calculation (min) 49 min    Activity Tolerance Patient tolerated treatment well;No increased pain    Behavior During Therapy [facility] for tasks assessed/performed                  Past Medical History:  Diagnosis Date   Coronary artery disease    Nonobstructive per cath in 2005 and 2006 with 50% LAD stenosis, rechecked with IVUS  during cath in 2006.   Diabetes mellitus    Hyperlipidemia    Hypertension    Obesity, morbid (HCC)    OSA (obstructive sleep apnea)    Uses CPAP   Stage 4 chronic kidney disease ([place])    Per patient   Past Surgical History:  Procedure Laterality Date   CHOLECYSTECTOMY     NASAL SINUS SURGERY     Patient Active Problem List   Diagnosis Date Noted   Chronic low back pain with bilateral sciatica 09/04/2020   Joint stiffness 09/04/2020   Diaphragmatic hernia 11/22/2019   Osteoarthritis 11/22/2019   Type II or unspecified type diabetes mellitus without mention of complication, uncontrolled 03/18/2017   Mild nonproliferative diabetic retinopathy ([place]) 03/18/2017   Nuclear sclerosis 03/18/2017   ED (erectile dysfunction) of organic origin 10/06/2016   Elevated prostate specific antigen (PSA) 10/06/2016   Male hypogonadism 10/06/2016   Stage 4 chronic kidney disease ([place]) 10/06/2016   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   IgA monoclonal gammopathy of uncertain significance 06/03/2016   CAD (coronary artery disease) 08/17/2010   Mixed hyperlipidemia 08/17/2010   Hypertension  08/17/2010   OSA (obstructive sleep apnea) 08/17/2010   Morbid obesity ([place]) 08/17/2010   Esophageal reflux 06/19/2001   Hypersomnia with sleep apnea 04/30/2000   Hypertrophy of prostate without urinary obstruction and other lower urinary tract symptoms (LUTS) 04/30/1999   Proteinuria 04/30/1999    PCP: Majel Homer, PA  REFERRING PROVIDER: Ileene Rubens, MD  REFERRING DIAG: Other spondylosis with radiculopathy, lumbar region; Spinal stenosis of lumbar region with neurogenic claudication  Rationale for Evaluation and Treatment: Rehabilitation  THERAPY DIAG:  Other low back pain  Stiffness of left hip, not elsewhere classified  Muscle spasm of back  ONSET DATE: 3 years ago  SUBJECTIVE:  SUBJECTIVE STATEMENT: Getting better, still a little sore. The leg press machine helped a whole lot.   PERTINENT HISTORY:  DM, chronic back pain   PAIN:  Are you having pain? Yes: NPRS scale: 4/10 Pain location: upper buttocks and legs Pain description: back and legs Aggravating factors: walking Relieving factors: rest - only with standing or walking immediate pain in the buttock region down to the knees Voltaren gel seems to help some with the pain  PRECAUTIONS: None  WEIGHT BEARING RESTRICTIONS: No  FALLS:  Has patient fallen in last 6 months? No  LIVING ENVIRONMENT: Lives with: lives with their family Lives in: House/apartment Stairs: No 6 steps at work Has following equipment at home: None  OCCUPATION: drive trucks   PLOF: Independent  PATIENT GOALS: decrease pain   NEXT MD VISIT: nothing set for now  OBJECTIVE:   DIAGNOSTIC FINDINGS:  05/22/21 IMPRESSION: Multilevel lumbar spondylosis.   Progressive disc protrusion at L2-3 with progressive moderate spinal stenosis. Other levels  are stable from 2020 MRI.  PATIENT SURVEYS:  Next visit  SCREENING FOR RED FLAGS: Bowel or bladder incontinence: No Spinal tumors: No Cauda equina syndrome: No Compression fracture: No Abdominal aneurysm: No  COGNITION: Overall cognitive status: Within functional limits for tasks assessed     SENSATION: Tingling Rt anterior thigh   MUSCLE LENGTH: Hamstrings: Right WNL deg; Left WNL deg Marcello Moores test: Right 100 deg; Left 100 deg  POSTURE:  forward trunk lean  PALPATION: Muscle spasm and tenderness in bilateral glutes  LUMBAR ROM:   AROM eval 02/06/22 02/13/22  Flexion Minimal spinal flexion noted (+) pain coming back up Minimal spinal flexion noted (-) pain coming back up Close to touch toes, no pain  Extension Unable to extend beyond neutral   (+) pain increase with extension x10 in standing Beyond neutral no pain  Right lateral flexion     Left lateral flexion     Right rotation (+) pulling Lt buttock  same Pain at end range  Left rotation (+) pulling Rt buttock same Pain at end range   (Blank rows = not tested)  LOWER EXTREMITY ROM:     Active  Right eval Left eval  Hip flexion    Hip extension -5 -5  Hip abduction    Hip adduction    Hip internal rotation 10 10 (+)Rt buttock pain  Hip external rotation 20 20  Knee flexion    Knee extension    Ankle dorsiflexion    Ankle plantarflexion    Ankle inversion    Ankle eversion     (Blank rows = not tested)  LOWER EXTREMITY MMT:    MMT Right eval Left eval Right  02/13/22 Left 02/13/22  Hip flexion 5 5    Hip extension _0 Hip abduction _1 Hip adduction      Hip internal rotation      Hip external rotation      Knee flexion 5 5    Knee extension 5 5    Ankle dorsiflexion 5 5    Ankle plantarflexion      Ankle inversion      Ankle eversion       (Blank rows = not tested)  LUMBAR SPECIAL TESTS:  Slump test: Negative  FUNCTIONAL TESTS:    GAIT: Distance walked: 73f Assistive  device utilized: None Level of assistance: Complete Independence Comments: pain after walking 238f minimal trunk rotation   TODAY'S TREATMENT:  DATE:  02/13/22 NuStep L5 x56mns Check ROM for progress notes Rows 25# 2x10 Lats 35# x10, 45# x10 blackTB ext 2x10 Pball roll outs forwards and out to side x10 each way Leg press 60# 2x10 IFC and MH 10 mins to lumbar spine  02/11/22 NuStep L4 x629ms  Stretching HS, knees to chest, trunk rotations Bridges 2x10 Leg ext 10# 2x10 HS curls 35# 2x10 Leg press 60# 2x10 AR press 20# 2x10 STS with ball push out yellow 2x5   02/06/22 Manual Tx Supine therapy ball stretch with PT assisting overpressure B knees to chest, low trunk rotation x20 reps PROM Lt glute med and piriformis stretch 5x10 sec hold each side Therex Lat pulldown #35 x15 reps, #45 x10 reps  Seated lumbar extension black TB x15 reps  Seated diagonal chops #25 2x10 reps each side  Standing lumbar extension with towel  Seated upper lumbar extension over half foam roll x10 Seated trunk flexion with and without therapy ball x15  02/04/22 Nustep level 5 x 6 minutes Leg curls 25# Leg extension 10# Seated row 25# Lat pulls 25# Black tband lumbar extension 35# pull down Feet on ball K2C, trunk rotation, small bridges, isometric abs Passive HS, piriformis and trunk rotation stretches  01/30/22: Manual Tx CPAs L1-L5 x2 bouts 30 sec each Lt and Rt lumbar gapping in sidelying x60 sec each STM lumbosacral region Therex Seated abdominal bracing  Seated pallof press green TB x15 reps each side Seated diagonal chops with focus on exhale to prevent abdominal coning- #15 x20 each side Standing quad stretch 2x30 sec each   01/28/22 Nustep level 5 x 6 minutes Leg extension 10# 2x10 Leg curls 25# 2x10 Seated row 20# Lats 20# Black tband lumbar  extension 35# triceps Ball roll outs in sitting Feet on ball K2C, trunk rotation, posterior isometric, abdominal isometrics Passive HS and piriformis stretch   01/25/22: Sidelying thoracic rotation x10 each side  Supine abdominal pressure management with breathing and verbal/tactile cuing 2x10 Supine diagonal chops with 1 UE green TB proper abdominal pressure maintained Supine diagonal chops with 2 UE green TB x5 reps Supine LE on green physioball LTR x10 Seated lat pulldowns #35 x10 reps (+) coning noted Seated TrA activation 10x5sec hold  Standing quad stretch in chair 2x30 sec     PATIENT EDUCATION:  Education details: eval findings/POC Person educated: Patient Education method: Explanation Education comprehension: verbalized understanding and returned demonstration  HOME EXERCISE PROGRAM: Access Code: [email or web address]: [email or web address] Date: 02/06/2022 Prepared by: [facility]Exercises - Supine Lower Trunk Rotation  - 1 x daily - 7 x weekly - 1 sets - 10 reps - Sidelying Thoracic Lumbar Rotation  - 1 x daily - 7 x weekly - 2 sets - 10 reps - Standing Quad Stretch with Table and Chair Support  - 2 x daily - 7 x weekly - 3 sets -  seconds hold - Seated Anti-Rotation Press With Anchored Resistance  - 1 x daily - 7 x weekly - 2 sets - 20 reps - Seated Diagonal Chops with Resistance  - 1 x daily - 7 x weekly - 3 sets - 10 reps  ASSESSMENT:  CLINICAL IMPRESSION: Patient has met most of his goals but continues to have low back pain. He has made good progress with range and strength. Will benefit from ongoing PT to keep working on low back strengthening and mobility to help with his pain. Also can try different modalities. We  did e-stim and moist heat today.   OBJECTIVE IMPAIRMENTS: Abnormal gait, decreased activity tolerance, decreased mobility, difficulty walking, decreased ROM, decreased strength, hypomobility,  increased muscle spasms, impaired flexibility, improper body mechanics, postural dysfunction, obesity, and pain.   ACTIVITY LIMITATIONS: lifting, bending, standing, sleeping, bed mobility, and locomotion level  PARTICIPATION LIMITATIONS: cleaning, laundry, interpersonal relationship, community activity, yard work, and walking  PERSONAL FACTORS: Age, Fitness, Past/current experiences, Profession, Time since onset of injury/illness/exacerbation, and 1 comorbidity: DM  are also affecting patient's functional outcome.   REHAB POTENTIAL: Good  CLINICAL DECISION MAKING: Stable/uncomplicated  EVALUATION COMPLEXITY: Low   GOALS: Goals reviewed with patient? Yes  SHORT TERM GOALS: Target date: 02/04/22  Patient will be independent with his HEP Baseline: Goal status: met   LONG TERM GOALS: Target date: 03/25/22  Pt will have improved lumbar active ROM with 50% improvement in pain in all directions.  Baseline:  Goal status: MET  2.  Pt will be able to complete roll and bed mobility without coning compensation throughout his session. Baseline:  Goal status: progressing  3.  Pt will report atleast 50% improvement in his LBP from the start of PT. Baseline:  Goal status: progressing  4.  Pt will have 5/5 MMT strength of BLE. Baseline:  Goal status: MET   PLAN:  PT FREQUENCY: 2x/week  PT DURATION: 8 weeks  PLANNED INTERVENTIONS: Therapeutic exercises, Therapeutic activity, Neuromuscular re-education, Balance training, Gait training, Patient/Family education, Self Care, Joint mobilization, Joint manipulation, Aquatic Therapy, Dry Needling, Spinal mobilization, Cryotherapy, Moist heat, Manual therapy, and Re-evaluation.  PLAN FOR NEXT SESSION:    10:11 AM,02/13/22 Andris Baumann PT, [place] at [place]

## 2022-02-13 ENCOUNTER — Ambulatory Visit: Payer: 59

## 2022-02-13 DIAGNOSIS — M5459 Other low back pain: Secondary | ICD-10-CM | POA: Diagnosis not present

## 2022-02-13 DIAGNOSIS — M62830 Muscle spasm of back: Secondary | ICD-10-CM

## 2022-02-13 DIAGNOSIS — M25652 Stiffness of left hip, not elsewhere classified: Secondary | ICD-10-CM

## 2022-02-21 ENCOUNTER — Ambulatory Visit: Payer: 59 | Admitting: Physical Therapy

## 2022-02-21 NOTE — Therapy (Incomplete)
OUTPATIENT PHYSICAL THERAPY THORACOLUMBAR TREATMENT    Patient Name: [name] [name] MRN: [id number] DOB:October 27, 1950, 71 y.o., male Today's Date: 02/21/2022     Past Medical History:  Diagnosis Date   Coronary artery disease    Nonobstructive per cath in 2005 and 2006 with 50% LAD stenosis, rechecked with IVUS  during cath in 2006.   Diabetes mellitus    Hyperlipidemia    Hypertension    Obesity, morbid (HCC)    OSA (obstructive sleep apnea)    Uses CPAP   Stage 4 chronic kidney disease ([place])    Per patient   Past Surgical History:  Procedure Laterality Date   CHOLECYSTECTOMY     NASAL SINUS SURGERY     Patient Active Problem List   Diagnosis Date Noted   Chronic low back pain with bilateral sciatica 09/04/2020   Joint stiffness 09/04/2020   Diaphragmatic hernia 11/22/2019   Osteoarthritis 11/22/2019   Type II or unspecified type diabetes mellitus without mention of complication, uncontrolled 03/18/2017   Mild nonproliferative diabetic retinopathy ([place]) 03/18/2017   Nuclear sclerosis 03/18/2017   ED (erectile dysfunction) of organic origin 10/06/2016   Elevated prostate specific antigen (PSA) 10/06/2016   Male hypogonadism 10/06/2016   Stage 4 chronic kidney disease ([place]) 10/06/2016   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   IgA monoclonal gammopathy of uncertain significance 06/03/2016   CAD (coronary artery disease) 08/17/2010   Mixed hyperlipidemia 08/17/2010   Hypertension 08/17/2010   OSA (obstructive sleep apnea) 08/17/2010   Morbid obesity ([place]) 08/17/2010   Esophageal reflux 06/19/2001   Hypersomnia with sleep apnea 04/30/2000   Hypertrophy of prostate without urinary obstruction and other lower urinary tract symptoms (LUTS) 04/30/1999   Proteinuria 04/30/1999    PCP: Majel Homer, PA  REFERRING PROVIDER: Ileene Rubens, MD  REFERRING DIAG: Other spondylosis with radiculopathy, lumbar region; Spinal stenosis of lumbar region with  neurogenic claudication  Rationale for Evaluation and Treatment: Rehabilitation  THERAPY DIAG:  No diagnosis found.  ONSET DATE: 3 years ago  SUBJECTIVE:                                                                                                                                                                                           SUBJECTIVE STATEMENT: Getting better, still a little sore. The leg press machine helped a whole lot.   PERTINENT HISTORY:  DM, chronic back pain   PAIN:  Are you having pain? Yes: NPRS scale: 4/10 Pain location: upper buttocks and legs Pain description: back and legs Aggravating factors: walking Relieving factors: rest - only with standing or walking immediate pain in the buttock  region down to the knees Voltaren gel seems to help some with the pain  PRECAUTIONS: None  WEIGHT BEARING RESTRICTIONS: No  FALLS:  Has patient fallen in last 6 months? No  LIVING ENVIRONMENT: Lives with: lives with their family Lives in: House/apartment Stairs: No 6 steps at work Has following equipment at home: None  OCCUPATION: drive trucks   PLOF: Independent  PATIENT GOALS: decrease pain   NEXT MD VISIT: nothing set for now  OBJECTIVE:   DIAGNOSTIC FINDINGS:  05/22/21 IMPRESSION: Multilevel lumbar spondylosis.   Progressive disc protrusion at L2-3 with progressive moderate spinal stenosis. Other levels are stable from 2020 MRI.  PATIENT SURVEYS:  Next visit  SCREENING FOR RED FLAGS: Bowel or bladder incontinence: No Spinal tumors: No Cauda equina syndrome: No Compression fracture: No Abdominal aneurysm: No  COGNITION: Overall cognitive status: Within functional limits for tasks assessed     SENSATION: Tingling Rt anterior thigh   MUSCLE LENGTH: Hamstrings: Right WNL deg; Left WNL deg Marcello Moores test: Right 100 deg; Left 100 deg  POSTURE:  forward trunk lean  PALPATION: Muscle spasm and tenderness in bilateral glutes  LUMBAR  ROM:   AROM eval 02/06/22 02/13/22  Flexion Minimal spinal flexion noted (+) pain coming back up Minimal spinal flexion noted (-) pain coming back up Close to touch toes, no pain  Extension Unable to extend beyond neutral   (+) pain increase with extension x10 in standing Beyond neutral no pain  Right lateral flexion     Left lateral flexion     Right rotation (+) pulling Lt buttock  same Pain at end range  Left rotation (+) pulling Rt buttock same Pain at end range   (Blank rows = not tested)  LOWER EXTREMITY ROM:     Active  Right eval Left eval  Hip flexion    Hip extension -5 -5  Hip abduction    Hip adduction    Hip internal rotation 10 10 (+)Rt buttock pain  Hip external rotation 20 20  Knee flexion    Knee extension    Ankle dorsiflexion    Ankle plantarflexion    Ankle inversion    Ankle eversion     (Blank rows = not tested)  LOWER EXTREMITY MMT:    MMT Right eval Left eval Right  02/13/22 Left 02/13/22  Hip flexion 5 5    Hip extension _0 Hip abduction _1 Hip adduction      Hip internal rotation      Hip external rotation      Knee flexion 5 5    Knee extension 5 5    Ankle dorsiflexion 5 5    Ankle plantarflexion      Ankle inversion      Ankle eversion       (Blank rows = not tested)  LUMBAR SPECIAL TESTS:  Slump test: Negative  FUNCTIONAL TESTS:    GAIT: Distance walked: 4f Assistive device utilized: None Level of assistance: Complete Independence Comments: pain after walking 292f minimal trunk rotation   TODAY'S TREATMENT:  DATE:  02/22/22 NuStep Rows and Lats Leg ext HS curls Leg press STS with blue ball OHP stretching  02/13/22 NuStep L5 x68mns Check ROM for progress notes Rows 25# 2x10 Lats 35# x10, 45# x10 blackTB ext 2x10 Pball roll outs forwards and out to side x10 each way Leg  press 60# 2x10 IFC and MH 10 mins to lumbar spine  02/11/22 NuStep L4 x652ms  Stretching HS, knees to chest, trunk rotations Bridges 2x10 Leg ext 10# 2x10 HS curls 35# 2x10 Leg press 60# 2x10 AR press 20# 2x10 STS with ball push out yellow 2x5   02/06/22 Manual Tx Supine therapy ball stretch with PT assisting overpressure B knees to chest, low trunk rotation x20 reps PROM Lt glute med and piriformis stretch 5x10 sec hold each side Therex Lat pulldown #35 x15 reps, #45 x10 reps  Seated lumbar extension black TB x15 reps  Seated diagonal chops #25 2x10 reps each side  Standing lumbar extension with towel  Seated upper lumbar extension over half foam roll x10 Seated trunk flexion with and without therapy ball x15  02/04/22 Nustep level 5 x 6 minutes Leg curls 25# Leg extension 10# Seated row 25# Lat pulls 25# Black tband lumbar extension 35# pull down Feet on ball K2C, trunk rotation, small bridges, isometric abs Passive HS, piriformis and trunk rotation stretches  01/30/22: Manual Tx CPAs L1-L5 x2 bouts 30 sec each Lt and Rt lumbar gapping in sidelying x60 sec each STM lumbosacral region Therex Seated abdominal bracing  Seated pallof press green TB x15 reps each side Seated diagonal chops with focus on exhale to prevent abdominal coning- #15 x20 each side Standing quad stretch 2x30 sec each   01/28/22 Nustep level 5 x 6 minutes Leg extension 10# 2x10 Leg curls 25# 2x10 Seated row 20# Lats 20# Black tband lumbar extension 35# triceps Ball roll outs in sitting Feet on ball K2C, trunk rotation, posterior isometric, abdominal isometrics Passive HS and piriformis stretch   01/25/22: Sidelying thoracic rotation x10 each side  Supine abdominal pressure management with breathing and verbal/tactile cuing 2x10 Supine diagonal chops with 1 UE green TB proper abdominal pressure maintained Supine diagonal chops with 2 UE green TB x5 reps Supine LE on green physioball  LTR x10 Seated lat pulldowns #35 x10 reps (+) coning noted Seated TrA activation 10x5sec hold  Standing quad stretch in chair 2x30 sec     PATIENT EDUCATION:  Education details: eval findings/POC Person educated: Patient Education method: Explanation Education comprehension: verbalized understanding and returned demonstration  HOME EXERCISE PROGRAM: Access Code: [email or web address]: [email or web address] Date: 02/06/2022 Prepared by: [facility]Exercises - Supine Lower Trunk Rotation  - 1 x daily - 7 x weekly - 1 sets - 10 reps - Sidelying Thoracic Lumbar Rotation  - 1 x daily - 7 x weekly - 2 sets - 10 reps - Standing Quad Stretch with Table and Chair Support  - 2 x daily - 7 x weekly - 3 sets -  seconds hold - Seated Anti-Rotation Press With Anchored Resistance  - 1 x daily - 7 x weekly - 2 sets - 20 reps - Seated Diagonal Chops with Resistance  - 1 x daily - 7 x weekly - 3 sets - 10 reps  ASSESSMENT:  CLINICAL IMPRESSION: Patient has met most of his goals but continues to have low back pain. He has made good progress with range and strength. Will benefit from ongoing PT to  keep working on low back strengthening and mobility to help with his pain. Also can try different modalities. We did e-stim and moist heat today.   OBJECTIVE IMPAIRMENTS: Abnormal gait, decreased activity tolerance, decreased mobility, difficulty walking, decreased ROM, decreased strength, hypomobility, increased muscle spasms, impaired flexibility, improper body mechanics, postural dysfunction, obesity, and pain.   ACTIVITY LIMITATIONS: lifting, bending, standing, sleeping, bed mobility, and locomotion level  PARTICIPATION LIMITATIONS: cleaning, laundry, interpersonal relationship, community activity, yard work, and walking  PERSONAL FACTORS: Age, Fitness, Past/current experiences, Profession, Time since onset of injury/illness/exacerbation, and 1  comorbidity: DM  are also affecting patient's functional outcome.   REHAB POTENTIAL: Good  CLINICAL DECISION MAKING: Stable/uncomplicated  EVALUATION COMPLEXITY: Low   GOALS: Goals reviewed with patient? Yes  SHORT TERM GOALS: Target date: 02/04/22  Patient will be independent with his HEP Baseline: Goal status: met   LONG TERM GOALS: Target date: 03/25/22  Pt will have improved lumbar active ROM with 50% improvement in pain in all directions.  Baseline:  Goal status: MET  2.  Pt will be able to complete roll and bed mobility without coning compensation throughout his session. Baseline:  Goal status: progressing  3.  Pt will report atleast 50% improvement in his LBP from the start of PT. Baseline:  Goal status: progressing  4.  Pt will have 5/5 MMT strength of BLE. Baseline:  Goal status: MET   PLAN:  PT FREQUENCY: 2x/week  PT DURATION: 8 weeks  PLANNED INTERVENTIONS: Therapeutic exercises, Therapeutic activity, Neuromuscular re-education, Balance training, Gait training, Patient/Family education, Self Care, Joint mobilization, Joint manipulation, Aquatic Therapy, Dry Needling, Spinal mobilization, Cryotherapy, Moist heat, Manual therapy, and Re-evaluation.  PLAN FOR NEXT SESSION:    1:17 PM,02/21/22 Welton Bord PT, [facility] [place]

## 2022-02-22 ENCOUNTER — Ambulatory Visit: Payer: 59

## 2022-02-22 NOTE — Therapy (Incomplete)
OUTPATIENT PHYSICAL THERAPY THORACOLUMBAR TREATMENT    Patient Name: [name] [name] MRN: [id number] DOB:October 27, 1950, 71 y.o., male Today's Date: 02/21/2022     Past Medical History:  Diagnosis Date   Coronary artery disease    Nonobstructive per cath in 2005 and 2006 with 50% LAD stenosis, rechecked with IVUS  during cath in 2006.   Diabetes mellitus    Hyperlipidemia    Hypertension    Obesity, morbid (HCC)    OSA (obstructive sleep apnea)    Uses CPAP   Stage 4 chronic kidney disease ([place])    Per patient   Past Surgical History:  Procedure Laterality Date   CHOLECYSTECTOMY     NASAL SINUS SURGERY     Patient Active Problem List   Diagnosis Date Noted   Chronic low back pain with bilateral sciatica 09/04/2020   Joint stiffness 09/04/2020   Diaphragmatic hernia 11/22/2019   Osteoarthritis 11/22/2019   Type II or unspecified type diabetes mellitus without mention of complication, uncontrolled 03/18/2017   Mild nonproliferative diabetic retinopathy ([place]) 03/18/2017   Nuclear sclerosis 03/18/2017   ED (erectile dysfunction) of organic origin 10/06/2016   Elevated prostate specific antigen (PSA) 10/06/2016   Male hypogonadism 10/06/2016   Stage 4 chronic kidney disease ([place]) 10/06/2016   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   IgA monoclonal gammopathy of uncertain significance 06/03/2016   CAD (coronary artery disease) 08/17/2010   Mixed hyperlipidemia 08/17/2010   Hypertension 08/17/2010   OSA (obstructive sleep apnea) 08/17/2010   Morbid obesity ([place]) 08/17/2010   Esophageal reflux 06/19/2001   Hypersomnia with sleep apnea 04/30/2000   Hypertrophy of prostate without urinary obstruction and other lower urinary tract symptoms (LUTS) 04/30/1999   Proteinuria 04/30/1999    PCP: Majel Homer, PA  REFERRING PROVIDER: Ileene Rubens, MD  REFERRING DIAG: Other spondylosis with radiculopathy, lumbar region; Spinal stenosis of lumbar region with  neurogenic claudication  Rationale for Evaluation and Treatment: Rehabilitation  THERAPY DIAG:  No diagnosis found.  ONSET DATE: 3 years ago  SUBJECTIVE:                                                                                                                                                                                           SUBJECTIVE STATEMENT: Getting better, still a little sore. The leg press machine helped a whole lot.   PERTINENT HISTORY:  DM, chronic back pain   PAIN:  Are you having pain? Yes: NPRS scale: 4/10 Pain location: upper buttocks and legs Pain description: back and legs Aggravating factors: walking Relieving factors: rest - only with standing or walking immediate pain in the buttock  region down to the knees Voltaren gel seems to help some with the pain  PRECAUTIONS: None  WEIGHT BEARING RESTRICTIONS: No  FALLS:  Has patient fallen in last 6 months? No  LIVING ENVIRONMENT: Lives with: lives with their family Lives in: House/apartment Stairs: No 6 steps at work Has following equipment at home: None  OCCUPATION: drive trucks   PLOF: Independent  PATIENT GOALS: decrease pain   NEXT MD VISIT: nothing set for now  OBJECTIVE:   DIAGNOSTIC FINDINGS:  05/22/21 IMPRESSION: Multilevel lumbar spondylosis.   Progressive disc protrusion at L2-3 with progressive moderate spinal stenosis. Other levels are stable from 2020 MRI.  PATIENT SURVEYS:  Next visit  SCREENING FOR RED FLAGS: Bowel or bladder incontinence: No Spinal tumors: No Cauda equina syndrome: No Compression fracture: No Abdominal aneurysm: No  COGNITION: Overall cognitive status: Within functional limits for tasks assessed     SENSATION: Tingling Rt anterior thigh   MUSCLE LENGTH: Hamstrings: Right WNL deg; Left WNL deg Marcello Moores test: Right 100 deg; Left 100 deg  POSTURE:  forward trunk lean  PALPATION: Muscle spasm and tenderness in bilateral glutes  LUMBAR  ROM:   AROM eval 02/06/22 02/13/22  Flexion Minimal spinal flexion noted (+) pain coming back up Minimal spinal flexion noted (-) pain coming back up Close to touch toes, no pain  Extension Unable to extend beyond neutral   (+) pain increase with extension x10 in standing Beyond neutral no pain  Right lateral flexion     Left lateral flexion     Right rotation (+) pulling Lt buttock  same Pain at end range  Left rotation (+) pulling Rt buttock same Pain at end range   (Blank rows = not tested)  LOWER EXTREMITY ROM:     Active  Right eval Left eval  Hip flexion    Hip extension -5 -5  Hip abduction    Hip adduction    Hip internal rotation 10 10 (+)Rt buttock pain  Hip external rotation 20 20  Knee flexion    Knee extension    Ankle dorsiflexion    Ankle plantarflexion    Ankle inversion    Ankle eversion     (Blank rows = not tested)  LOWER EXTREMITY MMT:    MMT Right eval Left eval Right  02/13/22 Left 02/13/22  Hip flexion 5 5    Hip extension _0 Hip abduction _1 Hip adduction      Hip internal rotation      Hip external rotation      Knee flexion 5 5    Knee extension 5 5    Ankle dorsiflexion 5 5    Ankle plantarflexion      Ankle inversion      Ankle eversion       (Blank rows = not tested)  LUMBAR SPECIAL TESTS:  Slump test: Negative  FUNCTIONAL TESTS:    GAIT: Distance walked: 4f Assistive device utilized: None Level of assistance: Complete Independence Comments: pain after walking 292f minimal trunk rotation   TODAY'S TREATMENT:  DATE:  02/22/22 NuStep Rows and Lats Leg ext HS curls Leg press STS with blue ball OHP stretching  02/13/22 NuStep L5 x68mns Check ROM for progress notes Rows 25# 2x10 Lats 35# x10, 45# x10 blackTB ext 2x10 Pball roll outs forwards and out to side x10 each way Leg  press 60# 2x10 IFC and MH 10 mins to lumbar spine  02/11/22 NuStep L4 x652ms  Stretching HS, knees to chest, trunk rotations Bridges 2x10 Leg ext 10# 2x10 HS curls 35# 2x10 Leg press 60# 2x10 AR press 20# 2x10 STS with ball push out yellow 2x5   02/06/22 Manual Tx Supine therapy ball stretch with PT assisting overpressure B knees to chest, low trunk rotation x20 reps PROM Lt glute med and piriformis stretch 5x10 sec hold each side Therex Lat pulldown #35 x15 reps, #45 x10 reps  Seated lumbar extension black TB x15 reps  Seated diagonal chops #25 2x10 reps each side  Standing lumbar extension with towel  Seated upper lumbar extension over half foam roll x10 Seated trunk flexion with and without therapy ball x15  02/04/22 Nustep level 5 x 6 minutes Leg curls 25# Leg extension 10# Seated row 25# Lat pulls 25# Black tband lumbar extension 35# pull down Feet on ball K2C, trunk rotation, small bridges, isometric abs Passive HS, piriformis and trunk rotation stretches  01/30/22: Manual Tx CPAs L1-L5 x2 bouts 30 sec each Lt and Rt lumbar gapping in sidelying x60 sec each STM lumbosacral region Therex Seated abdominal bracing  Seated pallof press green TB x15 reps each side Seated diagonal chops with focus on exhale to prevent abdominal coning- #15 x20 each side Standing quad stretch 2x30 sec each   01/28/22 Nustep level 5 x 6 minutes Leg extension 10# 2x10 Leg curls 25# 2x10 Seated row 20# Lats 20# Black tband lumbar extension 35# triceps Ball roll outs in sitting Feet on ball K2C, trunk rotation, posterior isometric, abdominal isometrics Passive HS and piriformis stretch   01/25/22: Sidelying thoracic rotation x10 each side  Supine abdominal pressure management with breathing and verbal/tactile cuing 2x10 Supine diagonal chops with 1 UE green TB proper abdominal pressure maintained Supine diagonal chops with 2 UE green TB x5 reps Supine LE on green physioball  LTR x10 Seated lat pulldowns #35 x10 reps (+) coning noted Seated TrA activation 10x5sec hold  Standing quad stretch in chair 2x30 sec     PATIENT EDUCATION:  Education details: eval findings/POC Person educated: Patient Education method: Explanation Education comprehension: verbalized understanding and returned demonstration  HOME EXERCISE PROGRAM: Access Code: [email or web address]: [email or web address] Date: 02/06/2022 Prepared by: [facility]Ex[facility] - Supine Lower Trunk Rotation  - 1 x daily - 7 x weekly - 1 sets - 10 reps - Sidelying Thoracic Lumbar Rotation  - 1 x daily - 7 x weekly - 2 sets - 10 reps - Standing Quad Stretch with Table and Chair Support  - 2 x daily - 7 x weekly - 3 sets -  seconds hold - Seated Anti-Rotation Press With Anchored Resistance  - 1 x daily - 7 x weekly - 2 sets - 20 reps - Seated Diagonal Chops with Resistance  - 1 x daily - 7 x weekly - 3 sets - 10 reps  ASSESSMENT:  CLINICAL IMPRESSION: Patient has met most of his goals but continues to have low back pain. He has made good progress with range and strength. Will benefit from ongoing PT to  keep working on low back strengthening and mobility to help with his pain. Also can try different modalities. We did e-stim and moist heat today.   OBJECTIVE IMPAIRMENTS: Abnormal gait, decreased activity tolerance, decreased mobility, difficulty walking, decreased ROM, decreased strength, hypomobility, increased muscle spasms, impaired flexibility, improper body mechanics, postural dysfunction, obesity, and pain.   ACTIVITY LIMITATIONS: lifting, bending, standing, sleeping, bed mobility, and locomotion level  PARTICIPATION LIMITATIONS: cleaning, laundry, interpersonal relationship, community activity, yard work, and walking  PERSONAL FACTORS: Age, Fitness, Past/current experiences, Profession, Time since onset of injury/illness/exacerbation, and 1  comorbidity: DM  are also affecting patient's functional outcome.   REHAB POTENTIAL: Good  CLINICAL DECISION MAKING: Stable/uncomplicated  EVALUATION COMPLEXITY: Low   GOALS: Goals reviewed with patient? Yes  SHORT TERM GOALS: Target date: 02/04/22  Patient will be independent with his HEP Baseline: Goal status: met   LONG TERM GOALS: Target date: 03/25/22  Pt will have improved lumbar active ROM with 50% improvement in pain in all directions.  Baseline:  Goal status: MET  2.  Pt will be able to complete roll and bed mobility without coning compensation throughout his session. Baseline:  Goal status: progressing  3.  Pt will report atleast 50% improvement in his LBP from the start of PT. Baseline:  Goal status: progressing  4.  Pt will have 5/5 MMT strength of BLE. Baseline:  Goal status: MET   PLAN:  PT FREQUENCY: 2x/week  PT DURATION: 8 weeks  PLANNED INTERVENTIONS: Therapeutic exercises, Therapeutic activity, Neuromuscular re-education, Balance training, Gait training, Patient/Family education, Self Care, Joint mobilization, Joint manipulation, Aquatic Therapy, Dry Needling, Spinal mobilization, Cryotherapy, Moist heat, Manual therapy, and Re-evaluation.  PLAN FOR NEXT SESSION:    1:17 PM,02/21/22 Adonys Wildes PT, [facility] [place]

## 2022-02-26 ENCOUNTER — Ambulatory Visit: Payer: 59 | Attending: Orthopedic Surgery

## 2022-02-27 ENCOUNTER — Ambulatory Visit: Payer: 59

## 2022-07-09 ENCOUNTER — Observation Stay (HOSPITAL_BASED_OUTPATIENT_CLINIC_OR_DEPARTMENT_OTHER)
Admission: EM | Admit: 2022-07-09 | Discharge: 2022-07-10 | Disposition: A | Discharge status: Home / Self Care | Payer: 59 | Attending: Emergency Medicine | Admitting: Emergency Medicine

## 2022-07-09 ENCOUNTER — Emergency Department (HOSPITAL_BASED_OUTPATIENT_CLINIC_OR_DEPARTMENT_OTHER): Payer: 59

## 2022-07-09 ENCOUNTER — Other Ambulatory Visit: Payer: Self-pay

## 2022-07-09 ENCOUNTER — Encounter (HOSPITAL_BASED_OUTPATIENT_CLINIC_OR_DEPARTMENT_OTHER): Payer: Self-pay

## 2022-07-09 DIAGNOSIS — I129 Hypertensive chronic kidney disease with stage 1 through stage 4 chronic kidney disease, or unspecified chronic kidney disease: Secondary | ICD-10-CM | POA: Insufficient documentation

## 2022-07-09 DIAGNOSIS — Z7984 Long term (current) use of oral hypoglycemic drugs: Secondary | ICD-10-CM | POA: Diagnosis not present

## 2022-07-09 DIAGNOSIS — Z87891 Personal history of nicotine dependence: Secondary | ICD-10-CM | POA: Diagnosis not present

## 2022-07-09 DIAGNOSIS — E1122 Type 2 diabetes mellitus with diabetic chronic kidney disease: Secondary | ICD-10-CM | POA: Diagnosis not present

## 2022-07-09 DIAGNOSIS — Z79899 Other long term (current) drug therapy: Secondary | ICD-10-CM | POA: Insufficient documentation

## 2022-07-09 DIAGNOSIS — G4733 Obstructive sleep apnea (adult) (pediatric): Secondary | ICD-10-CM | POA: Diagnosis not present

## 2022-07-09 DIAGNOSIS — I25110 Atherosclerotic heart disease of native coronary artery with unstable angina pectoris: Secondary | ICD-10-CM | POA: Diagnosis not present

## 2022-07-09 DIAGNOSIS — N184 Chronic kidney disease, stage 4 (severe): Secondary | ICD-10-CM | POA: Insufficient documentation

## 2022-07-09 DIAGNOSIS — R0789 Other chest pain: Principal | ICD-10-CM | POA: Insufficient documentation

## 2022-07-09 DIAGNOSIS — I200 Unstable angina: Secondary | ICD-10-CM

## 2022-07-09 DIAGNOSIS — R079 Chest pain, unspecified: Secondary | ICD-10-CM | POA: Diagnosis present

## 2022-07-09 DIAGNOSIS — I10 Essential (primary) hypertension: Secondary | ICD-10-CM

## 2022-07-09 LAB — BASIC METABOLIC PANEL
Anion gap: 10 (ref 5–15)
BUN: 32 mg/dL — ABNORMAL HIGH (ref 8–23)
CO2: 22 mmol/L (ref 22–32)
Calcium: 9.2 mg/dL (ref 8.9–10.3)
Chloride: 103 mmol/L (ref 98–111)
Creatinine, Ser: 2.44 mg/dL — ABNORMAL HIGH (ref 0.61–1.24)
GFR, Estimated: 28 mL/min — ABNORMAL LOW (ref >60)
Glucose, Bld: 133 mg/dL — ABNORMAL HIGH (ref 70–99)
Potassium: 4.6 mmol/L (ref 3.5–5.1)
Sodium: 135 mmol/L (ref 135–145)

## 2022-07-09 LAB — TROPONIN I (HIGH SENSITIVITY)
Troponin I (High Sensitivity): 4 ng/L (ref <18)
Troponin I (High Sensitivity): 4 ng/L (ref <18)

## 2022-07-09 LAB — CBC
HCT: 37.6 % — ABNORMAL LOW (ref 39.0–52.0)
Hemoglobin: 12 g/dL — ABNORMAL LOW (ref 13.0–17.0)
MCH: 27.1 pg (ref 26.0–34.0)
MCHC: 31.9 g/dL (ref 30.0–36.0)
MCV: 84.9 fL (ref 80.0–100.0)
Platelets: 342 10*3/uL (ref 150–400)
RBC: 4.43 MIL/uL (ref 4.22–5.81)
RDW: 14.1 % (ref 11.5–15.5)
WBC: 6.3 10*3/uL (ref 4.0–10.5)
nRBC: 0 % (ref 0.0–0.2)

## 2022-07-09 NOTE — ED Provider Notes (Signed)
[place] [facility] Provider Note   CSN: [id number] Arrival date & time: 07/09/22  1901     History  Chief Complaint  Patient presents with   Chest Pain    [name] [name] is a 72 y.o. male.  HPI   Patient with medical history including CAD, hypertension, CKD stage IV, diabetes, presenting with complaints of right-sided chest pain, started few days ago, came on suddenly, feels it in the right part of his chest, pain is worsened with laying supine after eating.  Patient also notes that it worsened when he ambulates, every time he walks he feels worsening pain in his right chest he is unclear if this is due to movements or from exertion.  Patient is positive family history of cardiac abnormality brother recently got a stent, he is being treated for hypertension, hyperlipidemia, obesity, diabetes.  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    Home Medications Prior to Admission medications   Medication Sig Start Date End Date Taking? Authorizing Provider  amLODipine (NORVASC) 10 MG tablet Take 10 mg by mouth daily.   Yes [provider]  calcitRIOL (ROCALTROL) 0.25 MCG capsule Take 0.25 mcg by mouth every Monday, Wednesday, and Friday. Three times weekly 08/16/16  Yes [provider]  doxazosin (CARDURA) 2 MG tablet Take 2 mg by mouth 3 (three) times daily.   Yes [provider]  Dulaglutide (TRULICITY) 3 MG/0.5ML SOPN Inject 3 mg into the skin every Wednesday.   Yes [provider]  ergocalciferol (VITAMIN D2) 1.25 MG (50000 UT) capsule Take 50,000 Units by mouth once a week.   Yes [provider]  febuxostat (ULORIC) 40 MG tablet Take 40 mg by mouth daily.   Yes [provider]  ferrous sulfate 325 (65 FE) MG tablet Take 325 mg by mouth  2 (two) times daily with a meal.   Yes [provider]  furosemide (LASIX) 40 MG tablet Take 40 mg by mouth daily.   Yes [provider]  gemfibrozil (LOPID) 600 MG tablet Take 600 mg by mouth 2 (two) times daily.   Yes [provider]  glipiZIDE (GLUCOTROL XL) 2.5 MG 24 hr tablet Take 2.5 mg by mouth daily with breakfast.   Yes [provider]  metoprolol succinate (TOPROL-XL) 25 MG 24 hr tablet Take 25 mg by mouth daily. 12/03/21  Yes [provider]  alum & mag hydroxide-simeth (MAALOX/MYLANTA) 200-200-20 MG/5ML suspension Take 15 mLs by mouth every 4 (four) hours as needed for indigestion or heartburn. 07/10/22   Glade Lloyd, MD  atorvastatin (LIPITOR) 10 MG tablet Take 1 tablet (10 mg total) by mouth daily. 07/11/22   Glade Lloyd, MD  nitroGLYCERIN (NITROSTAT) 0.4 MG SL tablet Place 1 tablet (0.4 mg total) under the tongue every 5 (five) minutes as needed for chest pain. 07/10/22   Glade Lloyd, MD  pantoprazole (PROTONIX) 40 MG tablet Take 1 tablet (40 mg total) by mouth daily. 07/10/22   Glade Lloyd, MD      Allergies    Olmesartan    Review of Systems   Review of Systems  Constitutional:  Negative for chills and fever.  Respiratory:  Negative for shortness of breath.   Cardiovascular:  Positive for chest pain.  Gastrointestinal:  Negative for abdominal pain.  Neurological:  Negative for headaches.    Physical Exam Updated Vital Signs BP (!) 140/61 (BP Location: Right Arm)   Pulse 60   Temp 98.4 F (36.9 C) (Oral)   Resp 20   Ht 5\' 6"  (1.676 m)   Wt 132.5 kg   SpO2 94%   BMI 47.13 kg/m  Physical Exam Vitals and nursing note reviewed.  Constitutional:      General: He is not in acute distress.    Appearance: He is not ill-appearing.  HENT:     Head: Normocephalic and atraumatic.     Nose: No congestion.  Eyes:     Conjunctiva/sclera: Conjunctivae normal.  Cardiovascular:     Rate and Rhythm: Normal rate and regular  rhythm.     Pulses: Normal pulses.     Heart sounds: No murmur heard.    No friction rub. No gallop.  Pulmonary:     Effort: No respiratory distress.     Breath sounds: No wheezing, rhonchi or rales.  Abdominal:     Palpations: Abdomen is soft.     Tenderness: There is no abdominal tenderness. There is no right CVA tenderness or left CVA tenderness.  Musculoskeletal:     Right lower leg: No edema.     Left lower leg: No edema.     Comments: No unilateral leg swelling no calf tenderness no palpable cords.  Skin:    General: Skin is warm and dry.  Neurological:     Mental Status: He is alert.  Psychiatric:        Mood and Affect: Mood normal.     ED Results / Procedures / Treatments   Labs (all labs ordered are listed, but only abnormal results are displayed) Labs Reviewed  BASIC METABOLIC PANEL - Abnormal; Notable for the following components:      Result Value   Glucose, Bld 133 (*)    BUN 32 (*)    Creatinine, Ser 2.44 (*)    GFR, Estimated 28 (*)    All other components within normal limits  CBC - Abnormal; Notable for the following components:   Hemoglobin 12.0 (*)    HCT 37.6 (*)    All other components within normal limits  GLUCOSE, CAPILLARY - Abnormal; Notable for the following components:   Glucose-Capillary 120 (*)    All other components within normal limits  COMPREHENSIVE METABOLIC PANEL - Abnormal; Notable for the following components:   Glucose, Bld 123 (*)    BUN 32 (*)    Creatinine, Ser 2.49 (*)    GFR, Estimated 27 (*)    All other components within normal limits  LIPID PANEL - Abnormal; Notable for the following components:   HDL 34 (*)    All other components within normal limits  HEMOGLOBIN A1C - Abnormal; Notable for the following components:   Hgb A1c MFr Bld 6.4 (*)    All other components within normal limits  GLUCOSE, CAPILLARY - Abnormal; Notable for the following components:   Glucose-Capillary 141 (*)    All other components within  normal limits  GLUCOSE, CAPILLARY - Abnormal; Notable for the following components:   Glucose-Capillary 125 (*)    All other components within normal limits  GLUCOSE, CAPILLARY - Abnormal; Notable for the following components:   Glucose-Capillary 159 (*)    All other components within normal limits  MAGNESIUM  TROPONIN I (HIGH SENSITIVITY)  TROPONIN I (HIGH SENSITIVITY)    EKG EKG Interpretation  Date/Time:  Tuesday Jul 09 2022 19:13:04 EDT Ventricular Rate:  66 PR Interval:  194 QRS Duration: 138 QT Interval:  409 QTC Calculation: 429 R Axis:   -7 Text Interpretation: Sinus rhythm Right bundle branch block since last tracing no significant change other than RBBB new from 2011 Confirmed by Rolan Bucco [phone]) on 07/09/2022 10:57:55 PM  Radiology No results found.  Procedures Procedures    Medications Ordered in ED Medications  perflutren lipid microspheres (DEFINITY) IV suspension (4 mLs Intravenous Given 07/10/22 1209)    ED Course/ Medical Decision Making/ A&P                             Medical Decision Making Amount and/or Complexity of Data Reviewed Labs: ordered. Radiology: ordered.  Risk Decision regarding hospitalization.   This patient presents to the ED for concern of chest pain, this involves an extensive number of treatment options, and is a complaint that carries with it a high risk of complications and morbidity.  The differential diagnosis includes ACS, PE, CHF, pneumonia    Additional history obtained:  Additional history obtained from N/A External records from outside source obtained and reviewed including orthopedic notes   Co morbidities that complicate the patient evaluation  Hypertension, hyperlipidemia, OSA  Social Determinants of Health:  N/A    Lab Tests:  I Ordered, and personally interpreted labs.  The pertinent results include: CBC shows normocytic anemia hemoglobin 12, BMP reveals a glucose of 133, BUN 32, creatinine  2.44, GFR 28, negative delta troponin   Imaging Studies ordered:  I ordered imaging studies including chest x-ray I independently visualized and interpreted imaging which showed unremarkable I agree with the radiologist interpretation   Cardiac Monitoring:  The patient was maintained on a cardiac monitor.  I personally viewed and interpreted the cardiac monitored which showed an underlying rhythm of: Without signs of ischemia   Medicines ordered and prescription drug management:  I ordered medication including N/A I have reviewed the patients home medicines and have made adjustments as needed  Critical Interventions:  N/A   Reevaluation:  Presents with chest pain, had a benign physical exam, will obtain screening labs and reassess  Patient was reassessed, states that when he got up to use the bathroom patient immediate pain with ambulation I am concerned patient's suffering from unstable angina, he also has an elevated heart score of 5, I rec admission for further evaluation patient agreed this plan will consult hospitalist for admission      Consultations Obtained:  Pending    Test Considered:  N/A    Rule out I have low suspicion for ACS as history is atypical,, EKG was sinus rhythm without signs of ischemia, patient had a delta troponin.  Low suspicion for PE as patient denies pleuritic chest pain, shortness of breath, patient denies leg pain, no pedal edema noted on exam, vital signs reassuring afebrile nontachycardic no new oxygen requirements.  Low suspicion for AAA or aortic dissection as history is atypical, patient has low risk factors.  Low suspicion for systemic infection as patient is nontoxic-appearing, vital signs reassuring, no obvious source infection noted on exam.     Dispostion and problem list  After consideration of the diagnostic results and the patients response to treatment, I feel that the patent would benefit from admission.  Due to  shift change patient was signed off to [place] to admit to hospitalist team.  Unstable angina-patient is elevated heart score and chest pain  with exertion, patient would likely benefit from stress as well as echocardiogram for further evaluation and likely full consultation by cardiology.             Final Clinical Impression(s) / ED Diagnoses Final diagnoses:  Unstable angina (HCC)    Rx / DC Orders ED Discharge Orders          Ordered    atorvastatin (LIPITOR) 10 MG tablet  Daily        07/10/22 1642    alum & mag hydroxide-simeth (MAALOX/MYLANTA) 200-200-20 MG/5ML suspension  Every 4 hours PRN        07/10/22 1642    pantoprazole (PROTONIX) 40 MG tablet  Daily        07/10/22 1642    nitroGLYCERIN (NITROSTAT) 0.4 MG SL tablet  Every 5 min PRN        07/10/22 1642    Increase activity slowly        07/10/22 1642    Diet - low sodium heart healthy        07/10/22 1642    Diet Carb Modified        07/10/22 1642    Ambulatory referral to Cardiology        07/10/22 1642              Barnie Del 07/16/22 1610    Horton, Clabe Seal, DO 07/19/22 [phone]

## 2022-07-09 NOTE — ED Triage Notes (Signed)
Pt complaining of pain in the center of chest at the epigastric region and radiates to the rt side of chest. Started last night and got worse as today went on.

## 2022-07-10 ENCOUNTER — Other Ambulatory Visit: Payer: Self-pay | Admitting: Cardiology

## 2022-07-10 ENCOUNTER — Observation Stay (HOSPITAL_BASED_OUTPATIENT_CLINIC_OR_DEPARTMENT_OTHER): Payer: 59

## 2022-07-10 DIAGNOSIS — R0789 Other chest pain: Secondary | ICD-10-CM

## 2022-07-10 DIAGNOSIS — G4733 Obstructive sleep apnea (adult) (pediatric): Secondary | ICD-10-CM

## 2022-07-10 DIAGNOSIS — R079 Chest pain, unspecified: Secondary | ICD-10-CM

## 2022-07-10 DIAGNOSIS — I25119 Atherosclerotic heart disease of native coronary artery with unspecified angina pectoris: Secondary | ICD-10-CM

## 2022-07-10 DIAGNOSIS — I10 Essential (primary) hypertension: Secondary | ICD-10-CM

## 2022-07-10 DIAGNOSIS — E119 Type 2 diabetes mellitus without complications: Secondary | ICD-10-CM | POA: Diagnosis not present

## 2022-07-10 LAB — LIPID PANEL
Cholesterol: 125 mg/dL (ref 0–200)
HDL: 34 mg/dL — ABNORMAL LOW (ref >40)
LDL Cholesterol: 74 mg/dL (ref 0–99)
Total CHOL/HDL Ratio: 3.7 RATIO
Triglycerides: 84 mg/dL (ref <150)
VLDL: 17 mg/dL (ref 0–40)

## 2022-07-10 LAB — ECHOCARDIOGRAM COMPLETE
Area-P 1/2: 2.86 cm2
Calc EF: 65.6 %
Height: 66 in
S' Lateral: 2.8 cm
Single Plane A2C EF: 66 %
Single Plane A4C EF: 64.6 %
Weight: 4672 oz

## 2022-07-10 LAB — GLUCOSE, CAPILLARY
Glucose-Capillary: 120 mg/dL — ABNORMAL HIGH (ref 70–99)
Glucose-Capillary: 141 mg/dL — ABNORMAL HIGH (ref 70–99)
Glucose-Capillary: 125 mg/dL — ABNORMAL HIGH (ref 70–99)
Glucose-Capillary: 159 mg/dL — ABNORMAL HIGH (ref 70–99)

## 2022-07-10 LAB — COMPREHENSIVE METABOLIC PANEL
ALT: 13 U/L (ref 0–44)
AST: 24 U/L (ref 15–41)
Albumin: 3.5 g/dL (ref 3.5–5.0)
Alkaline Phosphatase: 57 U/L (ref 38–126)
Anion gap: 10 (ref 5–15)
BUN: 32 mg/dL — ABNORMAL HIGH (ref 8–23)
CO2: 24 mmol/L (ref 22–32)
Calcium: 9.2 mg/dL (ref 8.9–10.3)
Chloride: 104 mmol/L (ref 98–111)
Creatinine, Ser: 2.49 mg/dL — ABNORMAL HIGH (ref 0.61–1.24)
GFR, Estimated: 27 mL/min — ABNORMAL LOW (ref >60)
Glucose, Bld: 123 mg/dL — ABNORMAL HIGH (ref 70–99)
Potassium: 5 mmol/L (ref 3.5–5.1)
Sodium: 138 mmol/L (ref 135–145)
Total Bilirubin: 0.6 mg/dL (ref 0.3–1.2)
Total Protein: 7.6 g/dL (ref 6.5–8.1)

## 2022-07-10 LAB — HEMOGLOBIN A1C
Hgb A1c MFr Bld: 6.4 % — ABNORMAL HIGH (ref 4.8–5.6)
Mean Plasma Glucose: 136.98 mg/dL

## 2022-07-10 LAB — MAGNESIUM: Magnesium: 1.9 mg/dL (ref 1.7–2.4)

## 2022-07-10 MED ORDER — GEMFIBROZIL 600 MG PO TABS
600.0000 mg | ORAL_TABLET | Freq: Two times a day (BID) | ORAL | Status: DC
  Filled 2022-07-10: qty 1

## 2022-07-10 MED ORDER — ALUM & MAG HYDROXIDE-SIMETH 200-200-20 MG/5ML PO SUSP: 15.0000 mL | ORAL | 0 refills | Status: AC | PRN

## 2022-07-10 MED ORDER — NITROGLYCERIN 0.4 MG SL SUBL: 0.4000 mg | SUBLINGUAL_TABLET | SUBLINGUAL | Status: DC | PRN

## 2022-07-10 MED ORDER — METOPROLOL SUCCINATE ER 25 MG PO TB24
25.0000 mg | ORAL_TABLET | Freq: Every day | ORAL | Status: DC
  Administered 2022-07-10: 25 mg via ORAL
  Filled 2022-07-10: qty 1

## 2022-07-10 MED ORDER — PANTOPRAZOLE SODIUM 40 MG PO TBEC
40.0000 mg | DELAYED_RELEASE_TABLET | Freq: Every day | ORAL | Status: DC
  Administered 2022-07-10: 40 mg via ORAL
  Filled 2022-07-10: qty 1

## 2022-07-10 MED ORDER — ACETAMINOPHEN 325 MG PO TABS: 650.0000 mg | ORAL_TABLET | ORAL | Status: DC | PRN

## 2022-07-10 MED ORDER — ENOXAPARIN SODIUM 40 MG/0.4ML IJ SOSY
40.0000 mg | PREFILLED_SYRINGE | INTRAMUSCULAR | Status: DC
  Administered 2022-07-10: 40 mg via SUBCUTANEOUS
  Filled 2022-07-10: qty 0.4

## 2022-07-10 MED ORDER — NITROGLYCERIN 0.4 MG SL SUBL: 0.4000 mg | SUBLINGUAL_TABLET | SUBLINGUAL | 0 refills | Status: AC | PRN

## 2022-07-10 MED ORDER — ATORVASTATIN CALCIUM 10 MG PO TABS
10.0000 mg | ORAL_TABLET | Freq: Every day | ORAL | Status: DC
  Administered 2022-07-10: 10 mg via ORAL
  Filled 2022-07-10: qty 1

## 2022-07-10 MED ORDER — INSULIN ASPART 100 UNIT/ML IJ SOLN
0.0000 [IU] | INTRAMUSCULAR | Status: DC
  Administered 2022-07-10: 3 [IU] via SUBCUTANEOUS
  Administered 2022-07-10: 4 [IU] via SUBCUTANEOUS

## 2022-07-10 MED ORDER — ALUM & MAG HYDROXIDE-SIMETH 200-200-20 MG/5ML PO SUSP: 15.0000 mL | ORAL | Status: DC | PRN

## 2022-07-10 MED ORDER — ATORVASTATIN CALCIUM 10 MG PO TABS: 10.0000 mg | ORAL_TABLET | Freq: Every day | ORAL | 0 refills | Status: DC

## 2022-07-10 MED ORDER — PANTOPRAZOLE SODIUM 40 MG PO TBEC: 40.0000 mg | DELAYED_RELEASE_TABLET | Freq: Every day | ORAL | 0 refills | Status: AC

## 2022-07-10 MED ORDER — MORPHINE SULFATE (PF) 2 MG/ML IV SOLN: 2.0000 mg | INTRAVENOUS | Status: DC | PRN

## 2022-07-10 MED ORDER — ONDANSETRON HCL 4 MG/2ML IJ SOLN: 4.0000 mg | Freq: Four times a day (QID) | INTRAMUSCULAR | Status: DC | PRN

## 2022-07-10 MED ORDER — PERFLUTREN LIPID MICROSPHERE
1.0000 mL | INTRAVENOUS | Status: AC | PRN
  Administered 2022-07-10: 4 mL via INTRAVENOUS

## 2022-07-10 MED ORDER — AMLODIPINE BESYLATE 10 MG PO TABS
10.0000 mg | ORAL_TABLET | Freq: Every day | ORAL | Status: DC
  Administered 2022-07-10: 10 mg via ORAL
  Filled 2022-07-10: qty 1

## 2022-07-10 NOTE — Progress Notes (Signed)
   Shared Decision Making/Informed Consent The risks [chest pain, shortness of breath, cardiac arrhythmias, dizziness, blood pressure fluctuations, myocardial infarction, stroke/transient ischemic attack, nausea, vomiting, allergic reaction, radiation exposure, metallic taste sensation and life-threatening complications (estimated to be 1 in 10,000)], benefits (risk stratification, diagnosing coronary artery disease, treatment guidance) and alternatives of a cardiac PET stress test were discussed in detail with [name] [name] and he agrees to proceed.   Jonita Albee, PA-C 07/10/2022 2:55 PM

## 2022-07-10 NOTE — ED Notes (Signed)
Called [facility] for transport at 12:57.  

## 2022-07-10 NOTE — H&P (Signed)
History and Physical    [name] [name]:[id number] DOB: [birth year] DOA: 07/09/2022  PCP: Legrand Pitts, PA   Patient coming from: Home  I have personally briefly reviewed patient's old medical records in [facility]  Chief Complaint: Chest pain  HPI: [name] [name] is a 72 y.o. male with medical history significant of hypertension, diabetes type 2, morbid obesity, OSA on CPAP, chronic kidney disease stage IV, nonobstructive coronary artery disease, hyperlipidemia presented with right-sided chest pain for the last few days.  Patient states that he has been having exertional right-sided chest pain which gets worse with laying supine and after eating and also after ambulating.  Denies fever, cough, palpitations, nausea, vomiting, leg pain or swelling, abdominal pain, diarrhea, dysuria, loss of consciousness or seizures.    ED Course: Troponins were 4 x 2.  EKG showed no acute ST/T changes but showed new right bundle branch block.  Chest x-ray was negative for any acute cardiopulmonary disease. Hospitalist service was called to evaluate the patient.  Review of Systems: As per HPI otherwise all other systems were reviewed and are negative.   Past Medical History:  Diagnosis Date   Coronary artery disease    Nonobstructive per cath in 2005 and 2006 with 50% LAD stenosis, rechecked with IVUS  during cath in 2006.   Diabetes mellitus    Hyperlipidemia    Hypertension    Obesity, morbid (HCC)    OSA (obstructive sleep apnea)    Uses CPAP   Stage 4 chronic kidney disease (HCC)    Per patient    Past Surgical History:  Procedure Laterality Date   CHOLECYSTECTOMY     NASAL SINUS SURGERY       reports that he quit smoking about 49 years ago. His smoking use included cigarettes. He has never used smokeless tobacco. He reports current alcohol use. He reports that he does not use drugs.  Allergies  Allergen Reactions   Olmesartan Other (See Comments)    States it caused  increase in potassium States it caused increase in potassium     Family History  Problem Relation Age of Onset   Heart failure Mother    Hypertension Mother    Diabetes Mother    Arthritis Mother    Stroke Father    Hypertension Father    Diabetes Sister    Hypertension Sister    Obesity Sister    Obesity Brother    Diabetes Brother    Hypertension Brother    Healthy Daughter     Prior to Admission medications   Medication Sig Start Date End Date Taking? Authorizing Provider  acetaminophen (TYLENOL) 500 MG tablet Take by mouth.    [provider]  amLODipine (NORVASC) 10 MG tablet Take 10 mg by mouth daily.    [provider]  ascorbic acid (VITAMIN C) 500 MG tablet daily.    [provider]  aspirin 81 MG tablet Take 81 mg by mouth daily.    [provider]  calcitRIOL (ROCALTROL) 0.25 MCG capsule Three times weekly 08/16/16   [provider]  diazepam (VALIUM) 5 MG tablet Take one tablet by mouth with food one hour prior to procedure. May repeat 30 minutes prior if needed. 08/29/21   Juanda Chance, NP  doxazosin (CARDURA) 2 MG tablet Take 2 mg by mouth 3 (three) times daily.    [provider]  Dulaglutide (TRULICITY) 3 MG/0.5ML SOPN once a week.    [provider]  ergocalciferol (VITAMIN D2) 1.25 MG (50000 UT) capsule Take by mouth once a week.    [provider]  febuxostat (ULORIC) 40 MG tablet Take 40 mg by mouth daily.    [provider]  ferrous sulfate 325 (65 FE) MG tablet Take 325 mg by mouth daily with breakfast.    [provider]  furosemide (LASIX) 40 MG tablet Take 20 mg by mouth daily.    [provider]  gemfibrozil (LOPID) 600 MG tablet Take 600 mg by mouth 2 (two) times daily.    [provider]  glimepiride (AMARYL) 4 MG tablet Take 4 mg by mouth daily before breakfast.    [provider]  labetalol (NORMODYNE) 200 MG tablet Take 200 mg by  mouth daily.    [provider]  lidocaine (LIDODERM) 5 % Place 1 patch onto the skin daily. Remove & Discard patch within 12 hours or as directed by MD 12/24/21   London Sheer, MD  metFORMIN (GLUCOPHAGE) 1000 MG tablet Take 1,000 mg by mouth 2 (two) times daily with a meal.    [provider]  Multiple Vitamin (ONE-A-DAY MENS PO) Take 1 tablet by mouth daily.    [provider]  saxagliptin HCl (ONGLYZA) 5 MG TABS tablet Take 5 mg by mouth daily.    [provider]  valsartan (DIOVAN) 160 MG tablet Take 160 mg by mouth daily.    [provider]    Physical Exam: Vitals:   07/10/22 0200 07/10/22 0300 07/10/22 0429 07/10/22 0749  BP: 129/74 (!) 141/76 (!) 137/56 (!) 142/63  Pulse: (!) 59 61 60 62  Resp: (!) 22 (!) 21 20 19   Temp:  98.2 F (36.8 C) 98.4 F (36.9 C) 97.9 F (36.6 C)  TempSrc:   Oral Oral  SpO2: 94% 94% 98% 99%  Weight:      Height:        Constitutional: NAD, calm, comfortable.  Currently on room air. Vitals:   07/10/22 0200 07/10/22 0300 07/10/22 0429 07/10/22 0749  BP: 129/74 (!) 141/76 (!) 137/56 (!) 142/63  Pulse: (!) 59 61 60 62  Resp: (!) 22 (!) 21 20 19   Temp:  98.2 F (36.8 C) 98.4 F (36.9 C) 97.9 F (36.6 C)  TempSrc:   Oral Oral  SpO2: 94% 94% 98% 99%  Weight:      Height:       Eyes: PERRL, lids and conjunctivae normal ENMT: Mucous membranes are moist. Posterior pharynx clear of any exudate or lesions. Neck: normal, supple, no masses, no thyromegaly Respiratory: bilateral decreased breath sounds at bases, no wheezing, no crackles.  Intermittently tachypneic. No accessory muscle use.  Cardiovascular: S1 S2 positive, rate controlled. No extremity edema. 2+ pedal pulses.  Abdomen: no tenderness, no masses palpated. No hepatosplenomegaly. Bowel sounds positive.  Musculoskeletal: no clubbing / cyanosis. No joint deformity upper and lower extremities.  Skin: no rashes, lesions, ulcers. No  induration Neurologic: CN 2-12 grossly intact. Moving extremities. No focal neurologic deficits.  Psychiatric: Normal judgment and insight. Alert and oriented x 3. Normal mood.    Labs on Admission: I have personally reviewed following labs and imaging studies  CBC: Recent Labs  Lab 07/09/22 1915  WBC 6.3  HGB 12.0*  HCT 37.6*  MCV 84.9  PLT 342   Basic Metabolic Panel: Recent Labs  Lab 07/09/22 1915  NA 135  K 4.6  CL 103  CO2 22  GLUCOSE 133*  BUN 32*  CREATININE  2.44*  CALCIUM 9.2   GFR: Estimated Creatinine Clearance: 36.9 mL/min (A) (by C-G formula based on SCr of 2.44 mg/dL (H)). Liver Function Tests: No results for input(s): "AST", "ALT", "ALKPHOS", "BILITOT", "PROT", "ALBUMIN" in the last 168 hours. No results for input(s): "LIPASE", "AMYLASE" in the last 168 hours. No results for input(s): "AMMONIA" in the last 168 hours. Coagulation Profile: No results for input(s): "INR", "PROTIME" in the last 168 hours. Cardiac Enzymes: No results for input(s): "CKTOTAL", "CKMB", "CKMBINDEX", "TROPONINI" in the last 168 hours. BNP (last 3 results) No results for input(s): "PROBNP" in the last 8760 hours. HbA1C: No results for input(s): "HGBA1C" in the last 72 hours. CBG: Recent Labs  Lab 07/10/22 0559  GLUCAP 120*   Lipid Profile: No results for input(s): "CHOL", "HDL", "LDLCALC", "TRIG", "CHOLHDL", "LDLDIRECT" in the last 72 hours. Thyroid Function Tests: No results for input(s): "TSH", "T4TOTAL", "FREET4", "T3FREE", "THYROIDAB" in the last 72 hours. Anemia Panel: No results for input(s): "VITAMINB12", "FOLATE", "FERRITIN", "TIBC", "IRON", "RETICCTPCT" in the last 72 hours. Urine analysis: No results found for: "COLORURINE", "APPEARANCEUR", "LABSPEC", "PHURINE", "GLUCOSEU", "HGBUR", "BILIRUBINUR", "KETONESUR", "PROTEINUR", "UROBILINOGEN", "NITRITE", "LEUKOCYTESUR"  Radiological Exams on Admission: DG Chest 2 View  Result Date: 07/09/2022 CLINICAL DATA:  Chest  pain. EXAM: CHEST - 2 VIEW COMPARISON:  None Available. FINDINGS: The heart size and mediastinal contours are within normal limits. Low lung volumes are noted with mild elevation of the right hemidiaphragm. Very mild linear atelectasis is seen within the right lung base. There is no evidence of acute infiltrate, pleural effusion or pneumothorax. Multilevel degenerative changes are seen throughout the thoracic spine. IMPRESSION: No active cardiopulmonary disease. Electronically Signed   By: Aram Candela M.D.   On: 07/09/2022 19:35    EKG: Independently reviewed.  No ST elevations or depressions noted  Assessment/Plan  Chest pain in a patient with history of nonobstructive CAD -Questionable cause.  Rule out unstable angina.  No ST/T wave changes on EKG but EKG showed new right bundle branch block.  Troponins x 2 were 4.  Still with intermittent chest pain. -2D echo.  I have consulted cardiology: Follow recommendations. -Use sublingual nitroglycerin as needed and IV morphine for severe pain  CKD stage IV -Creatinine possibly at baseline.  Monitor.  Anemia of chronic disease -Possibly from renal failure.  Hemoglobin stable.  No signs of bleeding.  Diabetes mellitus type 2 -Hold home oral meds.  CBGs with SSI.  Hypertension -Monitor blood pressure.  Home regimen needs to be verified before restarting them.  Hyperlipidemia -Verify home regimen.  Morbid obesity -Outpatient follow-up  OSA -Continue CPAP at night  DVT prophylaxis: Lovenox subcutaneous Code Status: Full Family Communication: None at bedside Disposition Plan: Home in 1 to 2 days once clinically improved and cleared by cardiology Consults called: Cardiology Admission status: Observation/telemetry  Severity of Illness: The appropriate patient status for this patient is OBSERVATION. Observation status is judged to be reasonable and necessary in order to provide the required intensity of service to ensure the patient's  safety. The patient's presenting symptoms, physical exam findings, and initial radiographic and laboratory data in the context of their medical condition is felt to place them at decreased risk for further clinical deterioration. Furthermore, it is anticipated that the patient will be medically stable for discharge from the hospital within 2 midnights of admission.     Glade Lloyd MD [facility]  07/10/2022, 8:11 AM

## 2022-07-10 NOTE — ED Provider Notes (Signed)
   Patient with medical history including CAD, hypertension, CKD stage IV, diabetes presenting with right-sided chest pain that is exertional in nature.  Worsening over the past couple of days.  No history of PE or DVT.  Heart score is 5.  New right bundle branch block.  No acute ST/T changes.  Both troponins 4 and flat.  Due to ongoing chest pain, requesting admission for high risk chest pain evaluation.  No evidence of NSTEMI.  Discussed with Dr. Julian Reil.   Mardene Sayer, MD 07/10/22 [phone]

## 2022-07-10 NOTE — Consult Note (Signed)
Cardiology Consultation   Patient ID: [name] [name] MRN: [id number]; DOB: May 27, 1950  Admit date: 07/09/2022 Date of Consult: 07/10/2022  PCP:  Legrand Pitts, PA   [facility] Providers Cardiologist:  None        Patient Profile:   [name] [name] is a 72 y.o. male with a hx of HTN, type 2 DM, morbid obesity, OSA on CPAP, CKD stage IV, nonobstructive CAD on cath in 2005 and 2006, HLD who is being seen 07/10/2022 for the evaluation of chest pain at the request of Dr. Hanley Ben.  History of Present Illness:   [name] [name] is a 72 year old male with above medical history. Per chart review patient was previously followed by Dr. Dietrich Pates, but has not been seen since 2012. He has a known history of nonobstructive CAD by cath in 2005 and 2006 with 50% LAD stenosis. In 2012, patient complained of worsening shortness of breath and underwent an exercise stress test on 09/07/10 that was negative for ischemia or infarction. Since then, patient has been followed by a cardiologist in [place], [place]. Reports that he gets stress tests every few years, and they have always been normal/without ischemia.  Reports that his most recent stress test was about 1 year ago.   Patient presented to the ED on 5/14 complaining of exertional right-sided chest pain that worsened when laying supine and after eating. Symptoms had been intermittent for the past few days. Initial EKG in the ED showed sinus rhythm, RBBB, HR 66 BPM. RBBB is new compared to EKG from 2012, but there are no other recent EKGs for comparison. hsTn 4>4. K 5.0, creatinine 2.49, WBC 6.3, hemoglobin 12.0, platelets 342. CXR showed no active cardiopulmonary disease. Patient was admitted to the internal medicine service, and cardiology was consulted for evaluation of chest pain.   Echocardiogram today showed EF 60-65%, mild LVH, grade I diastolic dysfunction, normal RV systolic function.   On interview, patient reports that for the past few weeks,  he has had epigastric discomfort that radiates/moves to the right side of his chest. Discomfort feels like a dull ache. Worse with exertion or after eating. He did take nexium with some improvement. Denies worsening shortness of breath on exertion recently. Denies cough, fever, chills, body aches, nausea, vomiting.    Past Medical History:  Diagnosis Date   Coronary artery disease    Nonobstructive per cath in 2005 and 2006 with 50% LAD stenosis, rechecked with IVUS  during cath in 2006.   Diabetes mellitus    Hyperlipidemia    Hypertension    Obesity, morbid (HCC)    OSA (obstructive sleep apnea)    Uses CPAP   Stage 4 chronic kidney disease (HCC)    Per patient    Past Surgical History:  Procedure Laterality Date   CHOLECYSTECTOMY     NASAL SINUS SURGERY       Home Medications:  Prior to Admission medications   Medication Sig Start Date End Date Taking? Authorizing Provider  amLODipine (NORVASC) 10 MG tablet Take 10 mg by mouth daily.   Yes [provider]  calcitRIOL (ROCALTROL) 0.25 MCG capsule Take 0.25 mcg by mouth every Monday, Wednesday, and Friday. Three times weekly 08/16/16  Yes [provider]  doxazosin (CARDURA) 2 MG tablet Take 2 mg by mouth 3 (three) times daily.   Yes [provider]  Dulaglutide (TRULICITY) 3 MG/0.5ML SOPN Inject 3 mg into the skin every Wednesday.   Yes [provider]  ergocalciferol (VITAMIN D2) 1.25 MG (50000 UT) capsule Take 50,000 Units by mouth once a week.   Yes [provider]  febuxostat (ULORIC) 40 MG tablet Take 40 mg by mouth daily.   Yes [provider]  ferrous sulfate 325 (65 FE) MG tablet Take 325 mg by mouth 2 (two) times daily with a meal.   Yes [provider]  furosemide (LASIX) 40 MG tablet Take 40 mg by mouth daily.   Yes [provider]  gemfibrozil (LOPID) 600 MG tablet Take 600 mg by mouth 2 (two) times daily.   Yes [provider]  glipiZIDE  (GLUCOTROL XL) 2.5 MG 24 hr tablet Take 2.5 mg by mouth daily with breakfast.   Yes [provider]  metoprolol succinate (TOPROL-XL) 25 MG 24 hr tablet Take 25 mg by mouth daily. 12/03/21  Yes [provider]  diazepam (VALIUM) 5 MG tablet Take one tablet by mouth with food one hour prior to procedure. May repeat 30 minutes prior if needed. Patient not taking: Reported on 07/10/2022 08/29/21   Juanda Chance, NP    Inpatient Medications: Scheduled Meds:  enoxaparin (LOVENOX) injection  40 mg Subcutaneous Q24H   insulin aspart  0-20 Units Subcutaneous Q4H   Continuous Infusions:  PRN Meds: acetaminophen, morphine injection, nitroGLYCERIN, ondansetron (ZOFRAN) IV, perflutren lipid microspheres (DEFINITY) IV suspension  Allergies:    Allergies  Allergen Reactions   Olmesartan Other (See Comments)    States it caused increase in potassium States it caused increase in potassium     Social History:   Social History   Socioeconomic History   Marital status: Married    Spouse name: Not on file   Number of children: Not on file   Years of education: Not on file   Highest education level: Not on file  Occupational History   Not on file  Tobacco Use   Smoking status: Former    Types: Cigarettes    Quit date: 27    Years since quitting: 49.4   Smokeless tobacco: Never  Vaping Use   Vaping Use: Never used  Substance and Sexual Activity   Alcohol use: Yes    Comment: Very rarely   Drug use: No   Sexual activity: Yes  Other Topics Concern   Not on file  Social History Narrative   Not on file   Social Determinants of Health   Financial Resource Strain: Not on file  Food Insecurity: Not on file  Transportation Needs: Not on file  Physical Activity: Not on file  Stress: Not on file  Social Connections: Not on file  Intimate Partner Violence: Not on file    Family History:    Family History  Problem Relation Age of Onset   Heart failure Mother     Hypertension Mother    Diabetes Mother    Arthritis Mother    Stroke Father    Hypertension Father    Diabetes Sister    Hypertension Sister    Obesity Sister    Obesity Brother    Diabetes Brother    Hypertension Brother    Healthy Daughter      ROS:  Please see the history of present illness.   All other ROS reviewed and negative.     Physical Exam/Data:   Vitals:   07/10/22 0300 07/10/22 0429 07/10/22 0749 07/10/22 1219  BP: (!) 141/76 (!) 137/56 (!) 142/63   Pulse: 61 60 62   Resp: (!) 21 20 19  Temp: 98.2 F (36.8 C) 98.4 F (36.9 C) 97.9 F (36.6 C) 98.1 F (36.7 C)  TempSrc:  Oral Oral Oral  SpO2: 94% 98% 99%   Weight:      Height:       No intake or output data in the 24 hours ending 07/10/22 1345    07/09/2022    7:09 PM 12/24/2021    8:21 AM 09/11/2020    2:57 PM  Last 3 Weights  Weight (lbs) 292 lb 302 lb 9.6 oz 297 lb 6.4 oz  Weight (kg) 132.45 kg 137.258 kg 134.9 kg     Body mass index is 47.13 kg/m.  General:  Well nourished, well developed, in no acute distress. Sitting comfortably on the side of the bed  HEENT: normal Neck: no JVD Vascular: Radial pulses 2+ bilaterally Cardiac:  normal S1, S2; RRR; no murmur  Lungs:  clear to auscultation bilaterally, no wheezing, rhonchi or rales. Normal work of breathing on room air  Abd: soft. Right upper quadrant is tender to deep palpation  Ext: no edema in BLE Musculoskeletal:  No deformities, BUE and BLE strength normal and equal Skin: warm and dry  Neuro:  CNs 2-12 intact, no focal abnormalities noted Psych:  Normal affect   EKG:  The EKG was personally reviewed and demonstrates:  NSR, RBBB, HR 66 BPM  Telemetry:  Telemetry was personally reviewed and demonstrates:  Sinus rhythm   Relevant CV Studies: Echocardiogram 07/10/22 1. Left ventricular ejection fraction, by estimation, is 60 to 65%. The  left ventricle has normal function. Left ventricular endocardial border  not optimally defined to  evaluate regional wall motion. There is mild  concentric left ventricular  hypertrophy. Left ventricular diastolic parameters are consistent with  Grade I diastolic dysfunction (impaired relaxation).   2. Right ventricular systolic function is normal. The right ventricular  size is moderately enlarged.   3. Left atrial size was mildly dilated.   4. The mitral valve was not well visualized. No evidence of mitral valve  regurgitation. No evidence of mitral stenosis.   5. The aortic valve was not well visualized. Aortic valve regurgitation  is not visualized.   6. The inferior vena cava is normal in size with <50% respiratory  variability, suggesting right atrial pressure of 8 mmHg.   Laboratory Data:  High Sensitivity Troponin:   Recent Labs  Lab 07/09/22 1915 07/09/22 2115  TROPONINIHS 4 4     Chemistry Recent Labs  Lab 07/09/22 1915 07/10/22 0922  NA 135 138  K 4.6 5.0  CL 103 104  CO2 22 24  GLUCOSE 133* 123*  BUN 32* 32*  CREATININE 2.44* 2.49*  CALCIUM 9.2 9.2  MG  --  1.9  GFRNONAA 28* 27*  ANIONGAP 10 10    Recent Labs  Lab 07/10/22 0922  PROT 7.6  ALBUMIN 3.5  AST 24  ALT 13  ALKPHOS 57  BILITOT 0.6   Lipids  Recent Labs  Lab 07/10/22 0922  CHOL 125  TRIG 84  HDL 34*  LDLCALC 74  CHOLHDL 3.7    Hematology Recent Labs  Lab 07/09/22 1915  WBC 6.3  RBC 4.43  HGB 12.0*  HCT 37.6*  MCV 84.9  MCH 27.1  MCHC 31.9  RDW 14.1  PLT 342   Thyroid No results for input(s): "TSH", "FREET4" in the last 168 hours.  BNPNo results for input(s): "BNP", "PROBNP" in the last 168 hours.  DDimer No results for input(s): "DDIMER" in the last  168 hours.   Radiology/Studies:  ECHOCARDIOGRAM COMPLETE  Result Date: 07/10/2022    ECHOCARDIOGRAM REPORT   Patient Name:   [name] [name] Date of Exam: 07/10/2022 Medical Rec #:  [id number]      Height:       66.0 in Accession #:    [id number]     Weight:       292.0 lb Date of Birth:  27-Dec-1950      BSA:           2.349 m Patient Age:    71 years       BP:           142/63 mmHg Patient Gender: M              HR:           60 bpm. Exam Location:  Inpatient Procedure: 2D Echo, Cardiac Doppler, Color Doppler and Intracardiac            Opacification Agent Indications:    R07.9* Chest pain, unspecified  History:        Patient has prior history of Echocardiogram examinations. CAD;                 Risk Factors:Hypertension, Diabetes and Dyslipidemia.  Sonographer:    Mike Gip Referring Phys: [id number] KSHITIZ ALEKH IMPRESSIONS  1. Left ventricular ejection fraction, by estimation, is 60 to 65%. The left ventricle has normal function. Left ventricular endocardial border not optimally defined to evaluate regional wall motion. There is mild concentric left ventricular hypertrophy. Left ventricular diastolic parameters are consistent with Grade I diastolic dysfunction (impaired relaxation).  2. Right ventricular systolic function is normal. The right ventricular size is moderately enlarged.  3. Left atrial size was mildly dilated.  4. The mitral valve was not well visualized. No evidence of mitral valve regurgitation. No evidence of mitral stenosis.  5. The aortic valve was not well visualized. Aortic valve regurgitation is not visualized.  6. The inferior vena cava is normal in size with <50% respiratory variability, suggesting right atrial pressure of 8 mmHg. Comparison(s): No prior Echocardiogram. FINDINGS  Left Ventricle: Left ventricular ejection fraction, by estimation, is 60 to 65%. The left ventricle has normal function. Left ventricular endocardial border not optimally defined to evaluate regional wall motion. Definity contrast agent was given IV to delineate the left ventricular endocardial borders. The left ventricular internal cavity size was normal in size. There is mild concentric left ventricular hypertrophy. Left ventricular diastolic parameters are consistent with Grade I diastolic dysfunction (impaired  relaxation). Right Ventricle: The right ventricular size is moderately enlarged. Right vetricular wall thickness was not well visualized. Right ventricular systolic function is normal. Left Atrium: Left atrial size was mildly dilated. Right Atrium: Right atrial size was normal in size. Pericardium: Trivial pericardial effusion is present. Presence of epicardial fat layer. Mitral Valve: The mitral valve was not well visualized. No evidence of mitral valve regurgitation. No evidence of mitral valve stenosis. Tricuspid Valve: The tricuspid valve is normal in structure. Tricuspid valve regurgitation is not demonstrated. No evidence of tricuspid stenosis. Aortic Valve: The aortic valve was not well visualized. Aortic valve regurgitation is not visualized. Pulmonic Valve: The pulmonic valve was normal in structure. Pulmonic valve regurgitation is not visualized. No evidence of pulmonic stenosis. Aorta: The aortic root and ascending aorta are structurally normal, with no evidence of dilitation. Venous: The inferior vena cava is normal in size with less than 50% respiratory variability, suggesting right atrial  pressure of 8 mmHg. IAS/Shunts: No atrial level shunt detected by color flow Doppler.  LEFT VENTRICLE PLAX 2D LVIDd:         4.50 cm      Diastology LVIDs:         2.80 cm      LV e' medial:    6.64 cm/s LV PW:         1.10 cm      LV E/e' medial:  14.6 LV IVS:        1.20 cm      LV e' lateral:   7.62 cm/s LVOT diam:     2.00 cm      LV E/e' lateral: 12.7 LV SV:         66 LV SV Index:   28 LVOT Area:     3.14 cm  LV Volumes (MOD) LV vol d, MOD A2C: 206.0 ml LV vol d, MOD A4C: 203.0 ml LV vol s, MOD A2C: 70.1 ml LV vol s, MOD A4C: 71.8 ml LV SV MOD A2C:     135.9 ml LV SV MOD A4C:     203.0 ml LV SV MOD BP:      136.9 ml RIGHT VENTRICLE             IVC RV Basal diam:  4.60 cm     IVC diam: 1.60 cm RV S prime:     13.60 cm/s TAPSE (M-mode): 2.4 cm LEFT ATRIUM             Index        RIGHT ATRIUM           Index LA  diam:        3.60 cm 1.53 cm/m   RA Area:     18.80 cm LA Vol (A2C):   86.8 ml 36.96 ml/m  RA Volume:   55.30 ml  23.54 ml/m LA Vol (A4C):   72.1 ml 30.70 ml/m LA Biplane Vol: 81.0 ml 34.49 ml/m  AORTIC VALVE LVOT Vmax:   94.20 cm/s LVOT Vmean:  60.000 cm/s LVOT VTI:    0.211 m  AORTA Ao Root diam: 3.60 cm Ao Asc diam:  3.20 cm MITRAL VALVE MV Area (PHT): 2.86 cm     SHUNTS MV E velocity: 96.80 cm/s   Systemic VTI:  0.21 m MV A velocity: 114.00 cm/s  Systemic Diam: 2.00 cm MV E/A ratio:  0.85 Riley Lam MD Electronically signed by Riley Lam MD Signature Date/Time: 07/10/2022/1:23:17 PM    Final    DG Chest 2 View  Result Date: 07/09/2022 CLINICAL DATA:  Chest pain. EXAM: CHEST - 2 VIEW COMPARISON:  None Available. FINDINGS: The heart size and mediastinal contours are within normal limits. Low lung volumes are noted with mild elevation of the right hemidiaphragm. Very mild linear atelectasis is seen within the right lung base. There is no evidence of acute infiltrate, pleural effusion or pneumothorax. Multilevel degenerative changes are seen throughout the thoracic spine. IMPRESSION: No active cardiopulmonary disease. Electronically Signed   By: Aram Candela M.D.   On: 07/09/2022 19:35     Assessment and Plan:   Chest Pain  - Patient presented complaining of epigastric ache that radiates to the right side of his chest. Pain is worse with eating and exertion. Did improve with nexium  - EKG shows RBBB (no EKGs to compare to since 2012) - hsTn negative x2. Patient has epigastric tenderness to palpation  - He has been  followed by a cardiologist in [facility]- had a stress test 1 year ago and it was normal  - Echocardiogram this admission showed EF 60-65%, grade I diastolic dysfunction, normal RV systolic function  - Given CKD (creatinine 2.49) patient not a candidate for coronary CTA.  - Recommend pet CT given his BMI, inability to walk on treadmill. This will be  completed as an outpatient. Orders placed, and I messaged our schedulers to arrange  - Resume home amlodipine 10 mg daily, metoprolol succinate 25 mg daily  Dyslipidemia - Lipid panel this admission showed LDL 74, HDL 34, triglycerides 84, total cholesterol 125  - Given his history of diabetes, patient should be on statin therapy  - Start lipitor 10 mg daily  - Will need lipid panel and LFTs in 3 months at follow up   HTN  - Resume home amlodipine 10 mg daily, metoprolol succinate 25 mg daily    Risk Assessment/Risk Scores:      For questions or updates, please contact [facility] Please consult [email or web address] for contact info under    Signed, Jonita Albee, PA-C  07/10/2022 1:45 PM

## 2022-07-10 NOTE — Discharge Summary (Signed)
Physician Discharge Summary  [name] [name] ZOX:[id number] DOB: [birth year] DOA: 07/09/2022  PCP: Legrand Pitts, [place]  Admit date: 07/09/2022 Discharge date: 07/10/2022  Admitted From: Home Disposition: Home  Recommendations for Outpatient Follow-up:  Follow up with PCP in 1 week  Outpatient follow up with cardiology Follow up in ED if symptoms worsen or new appear   Home Health: No Equipment/Devices: None  Discharge Condition: Stable CODE STATUS: Full Diet recommendation: Heart healthy/carb modified  Brief/Interim Summary: 72 y.o. male with medical history significant of hypertension, diabetes type 2, morbid obesity, OSA on CPAP, chronic kidney disease stage IV, nonobstructive coronary artery disease, hyperlipidemia presented with right-sided chest pain for the last few days. Troponins were 4 x 2. EKG showed no acute ST/T changes but showed new right bundle branch block. Chest x-ray was negative for any acute cardiopulmonary disease. Cardiology evaluated the patient and cleared him for discharge. He will be discharged home today with close outpatient follow up with cardiology.  Discharge Diagnoses:   Chest pain in a patient with history of nonobstructive CAD -Questionable cause.  No ST/T wave changes on EKG but EKG showed new right bundle branch block.  Troponins x 2 were 4.  Still with intermittent chest pain. -2D echo showed EF of 60-65% with grade I diastolic dysfunction. Cardiology evaluated the patient, recommended outpatient cardiac PET stress test and cleared him for discharge. He will be discharged home today with close outpatient follow up with cardiology. -Continue PPI on discharge   CKD stage IV -Creatinine possibly at baseline.  Outpatient followup   Anemia of chronic disease -Possibly from renal failure.  Hemoglobin stable.     Diabetes mellitus type 2 -resume home regimen. Carb modified diet   Hypertension -Monitor blood pressure.  Continue home  meds  Hyperlipidemia -Statin added by cardiology will be continued   Morbid obesity -Outpatient follow-up   OSA -Continue CPAP at night   Discharge Instructions  Discharge Instructions     Ambulatory referral to Cardiology   Complete by: As directed    Diet - low sodium heart healthy   Complete by: As directed    Diet Carb Modified   Complete by: As directed    Increase activity slowly   Complete by: As directed       Allergies as of 07/10/2022       Reactions   Olmesartan Other (See Comments)   States it caused increase in potassium States it caused increase in potassium        Medication List     STOP taking these medications    diazepam 5 MG tablet Commonly known as: VALIUM       TAKE these medications    alum & mag hydroxide-simeth 200-200-20 MG/5ML suspension Commonly known as: MAALOX/MYLANTA Take 15 mLs by mouth every 4 (four) hours as needed for indigestion or heartburn.   amLODipine 10 MG tablet Commonly known as: NORVASC Take 10 mg by mouth daily.   atorvastatin 10 MG tablet Commonly known as: LIPITOR Take 1 tablet (10 mg total) by mouth daily. Start taking on: Jul 11, 2022   calcitRIOL 0.25 MCG capsule Commonly known as: ROCALTROL Take 0.25 mcg by mouth every Monday, Wednesday, and Friday. Three times weekly   doxazosin 2 MG tablet Commonly known as: CARDURA Take 2 mg by mouth 3 (three) times daily.   ergocalciferol 1.25 MG (50000 UT) capsule Commonly known as: VITAMIN D2 Take 50,000 Units by mouth once a week.   febuxostat 40 MG tablet Commonly  known as: ULORIC Take 40 mg by mouth daily.   ferrous sulfate 325 (65 FE) MG tablet Take 325 mg by mouth 2 (two) times daily with a meal.   furosemide 40 MG tablet Commonly known as: LASIX Take 40 mg by mouth daily.   gemfibrozil 600 MG tablet Commonly known as: LOPID Take 600 mg by mouth 2 (two) times daily.   glipiZIDE 2.5 MG 24 hr tablet Commonly known as: GLUCOTROL  XL Take 2.5 mg by mouth daily with breakfast.   metoprolol succinate 25 MG 24 hr tablet Commonly known as: TOPROL-XL Take 25 mg by mouth daily.   nitroGLYCERIN 0.4 MG SL tablet Commonly known as: NITROSTAT Place 1 tablet (0.4 mg total) under the tongue every 5 (five) minutes as needed for chest pain.   pantoprazole 40 MG tablet Commonly known as: PROTONIX Take 1 tablet (40 mg total) by mouth daily.   Trulicity 3 MG/0.5ML Sopn Generic drug: Dulaglutide Inject 3 mg into the skin every Wednesday.        Follow-up Information     Legrand Pitts, [place]. Schedule an appointment as soon as possible for a visit in 1 week(s).   Specialty: Physician Assistant Contact information: [address] [place] [address] [phone]                Allergies  Allergen Reactions   Olmesartan Other (See Comments)    States it caused increase in potassium States it caused increase in potassium     Consultations: Cardiology   Procedures/Studies: ECHOCARDIOGRAM COMPLETE  Result Date: 07/10/2022    ECHOCARDIOGRAM REPORT   Patient Name:   [name] [name] Date of Exam: 07/10/2022 Medical Rec #:  [id number]      Height:       66.0 in Accession #:    [id number]     Weight:       292.0 lb Date of Birth:  Oct 31, 1950      BSA:          2.349 m Patient Age:    71 years       BP:           142/63 mmHg Patient Gender: M              HR:           60 bpm. Exam Location:  Inpatient Procedure: 2D Echo, Cardiac Doppler, Color Doppler and Intracardiac            Opacification Agent Indications:    R07.9* Chest pain, unspecified  History:        Patient has prior history of Echocardiogram examinations. CAD;                 Risk Factors:Hypertension, Diabetes and Dyslipidemia.  Sonographer:    Mike Gip Referring Phys: [id number] Emary Zalar IMPRESSIONS  1. Left ventricular ejection fraction, by estimation, is 60 to 65%. The left ventricle has normal function. Left ventricular endocardial border  not optimally defined to evaluate regional wall motion. There is mild concentric left ventricular hypertrophy. Left ventricular diastolic parameters are consistent with Grade I diastolic dysfunction (impaired relaxation).  2. Right ventricular systolic function is normal. The right ventricular size is moderately enlarged.  3. Left atrial size was mildly dilated.  4. The mitral valve was not well visualized. No evidence of mitral valve regurgitation. No evidence of mitral stenosis.  5. The aortic valve was not well visualized. Aortic valve regurgitation is not visualized.  6. The inferior  vena cava is normal in size with <50% respiratory variability, suggesting right atrial pressure of 8 mmHg. Comparison(s): No prior Echocardiogram. FINDINGS  Left Ventricle: Left ventricular ejection fraction, by estimation, is 60 to 65%. The left ventricle has normal function. Left ventricular endocardial border not optimally defined to evaluate regional wall motion. Definity contrast agent was given IV to delineate the left ventricular endocardial borders. The left ventricular internal cavity size was normal in size. There is mild concentric left ventricular hypertrophy. Left ventricular diastolic parameters are consistent with Grade I diastolic dysfunction (impaired relaxation). Right Ventricle: The right ventricular size is moderately enlarged. Right vetricular wall thickness was not well visualized. Right ventricular systolic function is normal. Left Atrium: Left atrial size was mildly dilated. Right Atrium: Right atrial size was normal in size. Pericardium: Trivial pericardial effusion is present. Presence of epicardial fat layer. Mitral Valve: The mitral valve was not well visualized. No evidence of mitral valve regurgitation. No evidence of mitral valve stenosis. Tricuspid Valve: The tricuspid valve is normal in structure. Tricuspid valve regurgitation is not demonstrated. No evidence of tricuspid stenosis. Aortic Valve: The  aortic valve was not well visualized. Aortic valve regurgitation is not visualized. Pulmonic Valve: The pulmonic valve was normal in structure. Pulmonic valve regurgitation is not visualized. No evidence of pulmonic stenosis. Aorta: The aortic root and ascending aorta are structurally normal, with no evidence of dilitation. Venous: The inferior vena cava is normal in size with less than 50% respiratory variability, suggesting right atrial pressure of 8 mmHg. IAS/Shunts: No atrial level shunt detected by color flow Doppler.  LEFT VENTRICLE PLAX 2D LVIDd:         4.50 cm      Diastology LVIDs:         2.80 cm      LV e' medial:    6.64 cm/s LV PW:         1.10 cm      LV E/e' medial:  14.6 LV IVS:        1.20 cm      LV e' lateral:   7.62 cm/s LVOT diam:     2.00 cm      LV E/e' lateral: 12.7 LV SV:         66 LV SV Index:   28 LVOT Area:     3.14 cm  LV Volumes (MOD) LV vol d, MOD A2C: 206.0 ml LV vol d, MOD A4C: 203.0 ml LV vol s, MOD A2C: 70.1 ml LV vol s, MOD A4C: 71.8 ml LV SV MOD A2C:     135.9 ml LV SV MOD A4C:     203.0 ml LV SV MOD BP:      136.9 ml RIGHT VENTRICLE             IVC RV Basal diam:  4.60 cm     IVC diam: 1.60 cm RV S prime:     13.60 cm/s TAPSE (M-mode): 2.4 cm LEFT ATRIUM             Index        RIGHT ATRIUM           Index LA diam:        3.60 cm 1.53 cm/m   RA Area:     18.80 cm LA Vol (A2C):   86.8 ml 36.96 ml/m  RA Volume:   55.30 ml  23.54 ml/m LA Vol (A4C):   72.1 ml 30.70 ml/m LA Biplane Vol: 81.0 ml  34.49 ml/m  AORTIC VALVE LVOT Vmax:   94.20 cm/s LVOT Vmean:  60.000 cm/s LVOT VTI:    0.211 m  AORTA Ao Root diam: 3.60 cm Ao Asc diam:  3.20 cm MITRAL VALVE MV Area (PHT): 2.86 cm     SHUNTS MV E velocity: 96.80 cm/s   Systemic VTI:  0.21 m MV A velocity: 114.00 cm/s  Systemic Diam: 2.00 cm MV E/A ratio:  0.85 Riley Lam MD Electronically signed by Riley Lam MD Signature Date/Time: 07/10/2022/1:23:17 PM    Final    DG Chest 2 View  Result Date:  07/09/2022 CLINICAL DATA:  Chest pain. EXAM: CHEST - 2 VIEW COMPARISON:  None Available. FINDINGS: The heart size and mediastinal contours are within normal limits. Low lung volumes are noted with mild elevation of the right hemidiaphragm. Very mild linear atelectasis is seen within the right lung base. There is no evidence of acute infiltrate, pleural effusion or pneumothorax. Multilevel degenerative changes are seen throughout the thoracic spine. IMPRESSION: No active cardiopulmonary disease. Electronically Signed   By: Aram Candela M.D.   On: 07/09/2022 19:35      Subjective: Patient seen and examined at bedside. Complains of intermittent right sided chest pain. No fever, cough or vomiting reported  Discharge Exam: Vitals:   07/10/22 1219 07/10/22 1528  BP:  (!) 140/61  Pulse:  60  Resp:  20  Temp: 98.1 F (36.7 C) 98.4 F (36.9 C)  SpO2:  94%    General: Pt is alert, awake, not in acute distress. Currently on room air Cardiovascular: rate controlled, S1/S2 + Respiratory: bilateral decreased breath sounds at bases Abdominal: Soft, morbidly obese, NT, ND, bowel sounds + Extremities: trace lower extremity edema, no cyanosis    The results of significant diagnostics from this hospitalization (including imaging, microbiology, ancillary and laboratory) are listed below for reference.     Microbiology: No results found for this or any previous visit (from the past 240 hour(s)).   Labs: BNP (last 3 results) No results for input(s): "BNP" in the last 8760 hours. Basic Metabolic Panel: Recent Labs  Lab 07/09/22 1915 07/10/22 0922  NA 135 138  K 4.6 5.0  CL 103 104  CO2 22 24  GLUCOSE 133* 123*  BUN 32* 32*  CREATININE 2.44* 2.49*  CALCIUM 9.2 9.2  MG  --  1.9   Liver Function Tests: Recent Labs  Lab 07/10/22 0922  AST 24  ALT 13  ALKPHOS 57  BILITOT 0.6  PROT 7.6  ALBUMIN 3.5   No results for input(s): "LIPASE", "AMYLASE" in the last 168 hours. No results  for input(s): "AMMONIA" in the last 168 hours. CBC: Recent Labs  Lab 07/09/22 1915  WBC 6.3  HGB 12.0*  HCT 37.6*  MCV 84.9  PLT 342   Cardiac Enzymes: No results for input(s): "CKTOTAL", "CKMB", "CKMBINDEX", "TROPONINI" in the last 168 hours. BNP: Invalid input(s): "POCBNP" CBG: Recent Labs  Lab 07/10/22 0559 07/10/22 0934 07/10/22 1058 07/10/22 1626  GLUCAP 120* 141* 125* 159*   D-Dimer No results for input(s): "DDIMER" in the last 72 hours. Hgb A1c Recent Labs    07/10/22 0922  HGBA1C 6.4*   Lipid Profile Recent Labs    07/10/22 0922  CHOL 125  HDL 34*  LDLCALC 74  TRIG 84  CHOLHDL 3.7   Thyroid function studies No results for input(s): "TSH", "T4TOTAL", "T3FREE", "THYROIDAB" in the last 72 hours.  Invalid input(s): "FREET3" Anemia work up No results for input(s): "VITAMINB12", "  FOLATE", "FERRITIN", "TIBC", "IRON", "RETICCTPCT" in the last 72 hours. Urinalysis No results found for: "COLORURINE", "APPEARANCEUR", "LABSPEC", "PHURINE", "GLUCOSEU", "HGBUR", "BILIRUBINUR", "KETONESUR", "PROTEINUR", "UROBILINOGEN", "NITRITE", "LEUKOCYTESUR" Sepsis Labs Recent Labs  Lab 07/09/22 1915  WBC 6.3   Microbiology No results found for this or any previous visit (from the past 240 hour(s)).   Time coordinating discharge: 35 minutes  SIGNED:   Glade Lloyd, MD  [facility] 07/10/2022, 4:49 PM

## 2022-07-10 NOTE — Progress Notes (Signed)
Echocardiogram 2D Echocardiogram has been performed.  [name] 07/10/2022, 12:08 PM

## 2022-07-31 NOTE — Progress Notes (Unsigned)
Cardiology Office Note:    Date:  08/01/2022   ID:  [name] [name], DOB 11/04/1950, MRN [id number]  PCP:  Legrand Pitts, [place]    HeartCare Providers Cardiologist:  Peter Swaziland, MD {  Referring MD: Legrand Pitts, [place]   Chief complaint: Follow up for chest pain and shortness of breath   History of Present Illness:    [name] [name] is a 72 y.o. male with a hx of hx of HTN, type 2 DM, morbid obesity, OSA on CPAP, CKD stage IV, nonobstructive CAD on cath in 2005 and 2006.  Previously followed by Dr. Dietrich Pates, but he has not been seen since 2012. He has a known history of nonobstructive CAD by cath in 2005 and 2006 with 50% LAD stenosis. In 2012, patient complained of worsening shortness of breath and underwent an exercise stress test on 09/07/10 that was negative for ischemia or infarction. Since then, patient has been followed by a cardiologist in [place], [place] he is currently in process of full cardiology care to Dr. Swaziland. He reportedly has had stress tests every few years and they have always been normal/without ischemia per patient.   He presented to the ED on 5/14 with complaints of several day exertional right sided chest pain that he described as a dull ache that worsened when laying supine and after eating. Had some improvement after taking Nexium. His EKG in the ED showed sinus rhythm at 66bpm with RBBB, the RBBB is new compared to EKGs from 2012. His labs and chest xray were unremarkable. His echo showed EF 60-65%, mild LVH, grade I diastolic dysfunction, normal RV systolic function.   Today he reports right upper quadrant pain a few times a week, worsens when laying flat and after eating, will occasionally have a "sour burp" with pain. Pain was present all day Sunday and Monday, feels it will spontaneously resolve or will relieve following a bowel movement. Reports his gallbladder was removed about 15 years ago. He does not feel that this pain is cardiac related. He  denies palpitations, orthopnea, dizziness or lower extremity swelling.   He notes his biggest concern is his shortness of breath, this has been ongoing for five or six years. He will walk short distances and become dyspneic, will require four to five minutes to recover. Notes he had a sleep study on Monday and has a new machine and new mask, feels his shortness of breath has been a little better since the change. From chart review he was seen by Dr. Steele Berg at [facility] in 2020, noted to have mild restrictive lung disease with normal DLCO, he felt this was secondary to morbid obesity.   Past Medical History:  Diagnosis Date   Coronary artery disease    Nonobstructive per cath in 2005 and 2006 with 50% LAD stenosis, rechecked with IVUS  during cath in 2006.   Diabetes mellitus    Hyperlipidemia    Hypertension    Obesity, morbid (HCC)    OSA (obstructive sleep apnea)    Uses CPAP   Stage 4 chronic kidney disease (HCC)    Per patient    Past Surgical History:  Procedure Laterality Date   CHOLECYSTECTOMY     NASAL SINUS SURGERY      Current Medications: Current Meds  Medication Sig   alum & mag hydroxide-simeth (MAALOX/MYLANTA) 200-200-20 MG/5ML suspension Take 15 mLs by mouth every 4 (four) hours as needed for indigestion or heartburn.   amLODipine (NORVASC) 10 MG tablet  Take 10 mg by mouth daily.   atorvastatin (LIPITOR) 10 MG tablet Take 1 tablet (10 mg total) by mouth daily.   calcitRIOL (ROCALTROL) 0.25 MCG capsule Take 0.25 mcg by mouth every Monday, Wednesday, and Friday. Three times weekly   doxazosin (CARDURA) 2 MG tablet Take 2 mg by mouth 3 (three) times daily.   Dulaglutide (TRULICITY) 3 MG/0.5ML SOPN Inject 3 mg into the skin every Wednesday.   ergocalciferol (VITAMIN D2) 1.25 MG (50000 UT) capsule Take 50,000 Units by mouth once a week.   febuxostat (ULORIC) 40 MG tablet Take 40 mg by mouth daily.   ferrous sulfate 325 (65 FE) MG tablet Take 325 mg by mouth 2  (two) times daily with a meal.   furosemide (LASIX) 40 MG tablet Take 40 mg by mouth daily.   gemfibrozil (LOPID) 600 MG tablet Take 600 mg by mouth 2 (two) times daily.   glipiZIDE (GLUCOTROL XL) 2.5 MG 24 hr tablet Take 2.5 mg by mouth daily with breakfast.   metoprolol succinate (TOPROL-XL) 25 MG 24 hr tablet Take 25 mg by mouth daily.   nitroGLYCERIN (NITROSTAT) 0.4 MG SL tablet Place 1 tablet (0.4 mg total) under the tongue every 5 (five) minutes as needed for chest pain.   pantoprazole (PROTONIX) 40 MG tablet Take 1 tablet (40 mg total) by mouth daily.     Allergies:   Olmesartan   Social History   Socioeconomic History   Marital status: Married    Spouse name: Not on file   Number of children: Not on file   Years of education: Not on file   Highest education level: Not on file  Occupational History   Not on file  Tobacco Use   Smoking status: Former    Types: Cigarettes    Quit date: 60    Years since quitting: 49.4   Smokeless tobacco: Never  Vaping Use   Vaping Use: Never used  Substance and Sexual Activity   Alcohol use: Yes    Comment: Very rarely   Drug use: No   Sexual activity: Yes  Other Topics Concern   Not on file  Social History Narrative   Not on file   Social Determinants of Health   Financial Resource Strain: Not on file  Food Insecurity: Not on file  Transportation Needs: Not on file  Physical Activity: Not on file  Stress: Not on file  Social Connections: Not on file     Family History: The patient's family history includes Arthritis in his mother; Diabetes in his brother, mother, and sister; Healthy in his daughter; Heart failure in his mother; Hypertension in his brother, father, mother, and sister; Obesity in his brother and sister; Stroke in his father.  ROS:   Please see the history of present illness.    All other systems reviewed and are negative.  EKGs/Labs/Other Studies Reviewed:    The following studies were reviewed  today:  EKG:  EKG not ordered today.   EKG 07/09/22: Sinus rhythm at 66bpm, RBBB   Echocardiogram 07/10/22 1. Left ventricular ejection fraction, by estimation, is 60 to 65%. The  left ventricle has normal function. Left ventricular endocardial border  not optimally defined to evaluate regional wall motion. There is mild  concentric left ventricular  hypertrophy. Left ventricular diastolic parameters are consistent with  Grade I diastolic dysfunction (impaired relaxation).   2. Right ventricular systolic function is normal. The right ventricular  size is moderately enlarged.   3. Left atrial size  was mildly dilated.   4. The mitral valve was not well visualized. No evidence of mitral valve  regurgitation. No evidence of mitral stenosis.   5. The aortic valve was not well visualized. Aortic valve regurgitation  is not visualized.   6. The inferior vena cava is normal in size with <50% respiratory  variability, suggesting right atrial pressure of 8 mmHg.  Recent Labs: 07/09/2022: Hemoglobin 12.0; Platelets 342 07/10/2022: ALT 13; BUN 32; Creatinine, Ser 2.49; Magnesium 1.9; Potassium 5.0; Sodium 138  Recent Lipid Panel    Component Value Date/Time   CHOL 125 07/10/2022 0922   TRIG 84 07/10/2022 0922   HDL 34 (L) 07/10/2022 0922   CHOLHDL 3.7 07/10/2022 0922   VLDL 17 07/10/2022 0922   LDLCALC 74 07/10/2022 0922    Physical Exam:    VS:  BP 136/78   Pulse 78   Ht 5\' 6"  (1.676 m)   Wt 291 lb (132 kg)   SpO2 98%   BMI 46.97 kg/m     Wt Readings from Last 3 Encounters:  08/01/22 291 lb (132 kg)  07/09/22 292 lb (132.5 kg)  12/24/21 (!) 302 lb 9.6 oz (137.3 kg)    GEN: Well nourished, well developed in no acute distress HEENT: Normal NECK: No JVD; No carotid bruits LYMPHATICS: No lymphadenopathy CARDIAC: RRR, no murmurs, rubs, gallops RESPIRATORY:  Clear to auscultation without rales, wheezing or rhonchi  ABDOMEN: Soft, non-tender, non-distended MUSCULOSKELETAL:  No  edema; No deformity  SKIN: Warm and dry NEUROLOGIC:  Alert and oriented x 3 PSYCHIATRIC:  Normal affect   ASSESSMENT:    1. Chest pain, rule out acute myocardial infarction   2. Coronary artery disease involving native heart, unspecified vessel or lesion type, unspecified whether angina present   3. Shortness of breath   4. Right upper quadrant abdominal pain   5. Mixed hyperlipidemia   6. Hypertension, unspecified type   7. OSA (obstructive sleep apnea)   8. Stage 4 chronic kidney disease (HCC)    PLAN:   Chest pain/Non-obstructive coronary artery disease: He presented to the ED 5/14 with epigastric pain that radiates to the right side of his chest, worsens with eating and exertion. Had some improvement with nexium. EKG shows RBBB, but last EKG for comparison is from 2012. He has been followed by a cardiologist in [facility], he had normal pharmacological nuclear stress test 1 year ago. Echo during admission showed EF 60-65%, grade I diastolic dysfunction, normal RV systolic function. He has history of CKD with creatinine 2.49, with this he is not a candidate for coronary CTA.  PET stress ordered from hospital visit, we will have our team to reach out to schedulers to see if we can expedite this. Continue amlodipine 10 mg daily, metoprolol succinate 25 mg daily, atorvastatin 10mg  daily and furosemide 40mg  daily.   Shortness of breath: He reports dyspnea on exertion that has been ongoing for the last five to six years. Today his lung sounds are clear and he appears euvolemic. With walking in the hallway his oxygen saturation started at 97% and decreased to 95%, initially his heart rate was in the mid 80's, increased to the 110's. He did report shortness of breath and appeared dyspneic. He saw a pulmonologist in 2020, noted mild restrictive lung disease with normal DLCO, he felt this was secondary to morbid obesity and potentially cardiac in nature. Per patient he had a nuclear stress test one  year ago that was normal with [place]. Cardiac PET stress pending as above. If normal, will consider referral to pulmonology.   Abdominal Pain: He is reporting right upper quadrant abdominal pain, that worsens with laying day and exertion. He has had some relief with Nexium and after bowel movements. Reports his gallbladder was removed about fifteen years ago. He will follow up with his primary care for further GI workup.   Dyslipidemia: Started on atorvastatin 10 mg tablet daily during admission. He will need lipid panel and LFTs recheck at 6-8 week follow up. Continue home gemfibrozil 600mg  daily.   Hypertension: Blood pressure initially 152/78, his recheck came down to 136/78. Continue amlodipine 10mg  daily, metoprolol succinate 25mg  daily and furosemide 40mg  daily.   Obstructive sleep apnea: He had repeat sleep study on Monday, he received new mask and machine. Notes improvement in sleep and some improvement in shortness of breath. Confirms nightly adherence to CPAP.   Diabetes mellitus type 2: On admission Hemoglobin A1C was 6.4. Managed by his primary care. Continue trulicity 3mg , glipizide 2.5mg  daily.   Stage 4 Chronic Kidney Disease: Previously managed by nephrologist and primary care in [place]. He plans to establish care and follow up with PCP and nephrology in [place] area.   Medication Adjustments/Labs and Tests Ordered: Current medicines are reviewed at length with the patient today.  Concerns regarding medicines are outlined above.  No orders of the defined types were placed in this encounter.  No orders of the defined types were placed in this encounter.   Patient Instructions  Medication Instructions:  Your physician recommends that you continue on your current medications as directed. Please refer to the Current Medication list given to you today.  *If you need a refill on your cardiac medications before your next appointment, please call your pharmacy*   Lab  Work: NONE ordered at this time of appointment   Testing/Procedures:Complete Cardiac PET test previously ordered.  How to Prepare for Your Cardiac PET/CT Stress Test:  1. Please do not take these medications before your test:   Medications that may interfere with the cardiac pharmacological stress agent (ex. nitrates - including erectile dysfunction medications, isosorbide mononitrate, tamulosin or beta-blockers) the day of the exam. (Erectile dysfunction medication should be held for at least 72 hrs prior to test) Theophylline containing medications for 12 hours. Dipyridamole 48 hours prior to the test. Your remaining medications may be taken with water.  2. Nothing to eat or drink, except water, 3 hours prior to arrival time.   NO caffeine/decaffeinated products, or chocolate 12 hours prior to arrival.  3. NO perfume, cologne or lotion  4. Total time is 1 to 2 hours; you may want to bring reading material for the waiting time.  5. Please report to Radiology at the [facility] Main Entrance 30 minutes early for your test.  [address] [place], [place] [address]  Diabetic Preparation:  Hold oral medications. You may take NPH and Lantus insulin. Do not take Humalog or Humulin R (Regular Insulin) the day of your test. Check blood sugars prior to leaving the house. If able to eat breakfast prior to 3 hour fasting, you may take all medications, including your insulin, Do not worry if you miss your breakfast dose of insulin - start at your next meal.  IF YOU THINK YOU MAY BE PREGNANT, OR ARE NURSING PLEASE INFORM THE TECHNOLOGIST.  In preparation for your appointment, medication and supplies will be purchased.  Appointment availability is limited, so if you need to  cancel or reschedule, please call the Radiology Department at [phone]  24 hours in advance to avoid a cancellation fee of $100.00  What to Expect After you Arrive:  Once you arrive and check in for  your appointment, you will be taken to a preparation room within the Radiology Department.  A technologist or Nurse will obtain your medical history, verify that you are correctly prepped for the exam, and explain the procedure.  Afterwards,  an IV will be started in your arm and electrodes will be placed on your skin for EKG monitoring during the stress portion of the exam. Then you will be escorted to the PET/CT scanner.  There, staff will get you positioned on the scanner and obtain a blood pressure and EKG.  During the exam, you will continue to be connected to the EKG and blood pressure machines.  A small, safe amount of a radioactive tracer will be injected in your IV to obtain a series of pictures of your heart along with an injection of a stress agent.    After your Exam:  It is recommended that you eat a meal and drink a caffeinated beverage to counter act any effects of the stress agent.  Drink plenty of fluids for the remainder of the day and urinate frequently for the first couple of hours after the exam.  Your doctor will inform you of your test results within 7-10 business days.  For questions about your test or how to prepare for your test, please call: Rockwell Alexandria, Cardiac Imaging Nurse Navigator  Larey Brick, Cardiac Imaging Nurse Navigator Office: [phone]    Follow-Up: At [facility], you and your health needs are our priority.  As part of our continuing mission to provide you with exceptional heart care, we have created designated Provider Care Teams.  These Care Teams include your primary Cardiologist (physician) and Advanced Practice Providers (APPs -  Physician Assistants and Nurse Practitioners) who all work together to provide you with the care you need, when you need it.  We recommend signing up for the patient portal called "MyChart".  Sign up information is provided on this After Visit Summary.  MyChart is used to connect with patients for Virtual Visits  (Telemedicine).  Patients are able to view lab/test results, encounter notes, upcoming appointments, etc.  Non-urgent messages can be sent to your provider as well.   To learn more about what you can do with MyChart, go to [email or web address].    Your next appointment:   6-8 week(s)  Provider:    Any APP  Other Instructions     Signed, Rip Harbour, NP  08/01/2022 4:56 PM    [facility]

## 2022-08-01 ENCOUNTER — Encounter: Payer: Self-pay | Admitting: Nurse Practitioner

## 2022-08-01 ENCOUNTER — Ambulatory Visit: Payer: 59 | Attending: Nurse Practitioner | Admitting: Cardiology

## 2022-08-01 VITALS — BP 136/78 | HR 78 | Ht 66.0 in | Wt 291.0 lb

## 2022-08-01 DIAGNOSIS — I2510 Atherosclerotic heart disease of native coronary artery without angina pectoris: Secondary | ICD-10-CM | POA: Diagnosis not present

## 2022-08-01 DIAGNOSIS — R1011 Right upper quadrant pain: Secondary | ICD-10-CM | POA: Diagnosis not present

## 2022-08-01 DIAGNOSIS — I10 Essential (primary) hypertension: Secondary | ICD-10-CM

## 2022-08-01 DIAGNOSIS — R0602 Shortness of breath: Secondary | ICD-10-CM | POA: Diagnosis not present

## 2022-08-01 DIAGNOSIS — E782 Mixed hyperlipidemia: Secondary | ICD-10-CM

## 2022-08-01 DIAGNOSIS — N184 Chronic kidney disease, stage 4 (severe): Secondary | ICD-10-CM

## 2022-08-01 DIAGNOSIS — R079 Chest pain, unspecified: Secondary | ICD-10-CM | POA: Diagnosis not present

## 2022-08-01 DIAGNOSIS — G4733 Obstructive sleep apnea (adult) (pediatric): Secondary | ICD-10-CM

## 2022-08-01 NOTE — Patient Instructions (Addendum)
Medication Instructions:  Your physician recommends that you continue on your current medications as directed. Please refer to the Current Medication list given to you today.  *If you need a refill on your cardiac medications before your next appointment, please call your pharmacy*   Lab Work: NONE ordered at this time of appointment   Testing/Procedures:Complete Cardiac PET test previously ordered.  How to Prepare for Your Cardiac PET/CT Stress Test:  1. Please do not take these medications before your test:   Medications that may interfere with the cardiac pharmacological stress agent (ex. nitrates - including erectile dysfunction medications, isosorbide mononitrate, tamulosin or beta-blockers) the day of the exam. (Erectile dysfunction medication should be held for at least 72 hrs prior to test) Theophylline containing medications for 12 hours. Dipyridamole 48 hours prior to the test. Your remaining medications may be taken with water.  2. Nothing to eat or drink, except water, 3 hours prior to arrival time.   NO caffeine/decaffeinated products, or chocolate 12 hours prior to arrival.  3. NO perfume, cologne or lotion  4. Total time is 1 to 2 hours; you may want to bring reading material for the waiting time.  5. Please report to Radiology at the [facility] Main Entrance 30 minutes early for your test.  [address] [place], [place] [address]  Diabetic Preparation:  Hold oral medications. You may take NPH and Lantus insulin. Do not take Humalog or Humulin R (Regular Insulin) the day of your test. Check blood sugars prior to leaving the house. If able to eat breakfast prior to 3 hour fasting, you may take all medications, including your insulin, Do not worry if you miss your breakfast dose of insulin - start at your next meal.  IF YOU THINK YOU MAY BE PREGNANT, OR ARE NURSING PLEASE INFORM THE TECHNOLOGIST.  In preparation for your appointment, medication and  supplies will be purchased.  Appointment availability is limited, so if you need to cancel or reschedule, please call the Radiology Department at [phone]  24 hours in advance to avoid a cancellation fee of $100.00  What to Expect After you Arrive:  Once you arrive and check in for your appointment, you will be taken to a preparation room within the Radiology Department.  A technologist or Nurse will obtain your medical history, verify that you are correctly prepped for the exam, and explain the procedure.  Afterwards,  an IV will be started in your arm and electrodes will be placed on your skin for EKG monitoring during the stress portion of the exam. Then you will be escorted to the PET/CT scanner.  There, staff will get you positioned on the scanner and obtain a blood pressure and EKG.  During the exam, you will continue to be connected to the EKG and blood pressure machines.  A small, safe amount of a radioactive tracer will be injected in your IV to obtain a series of pictures of your heart along with an injection of a stress agent.    After your Exam:  It is recommended that you eat a meal and drink a caffeinated beverage to counter act any effects of the stress agent.  Drink plenty of fluids for the remainder of the day and urinate frequently for the first couple of hours after the exam.  Your doctor will inform you of your test results within 7-10 business days.  For questions about your test or how to prepare for your test, please call: Rockwell Alexandria,  Cardiac Imaging Nurse Navigator  Larey Brick, Cardiac Imaging Nurse Navigator Office: [phone]    Follow-Up: At [facility], you and your health needs are our priority.  As part of our continuing mission to provide you with exceptional heart care, we have created designated Provider Care Teams.  These Care Teams include your primary Cardiologist (physician) and Advanced Practice Providers (APPs -  Physician Assistants and  Nurse Practitioners) who all work together to provide you with the care you need, when you need it.  We recommend signing up for the patient portal called "MyChart".  Sign up information is provided on this After Visit Summary.  MyChart is used to connect with patients for Virtual Visits (Telemedicine).  Patients are able to view lab/test results, encounter notes, upcoming appointments, etc.  Non-urgent messages can be sent to your provider as well.   To learn more about what you can do with MyChart, go to [email or web address].    Your next appointment:   6-8 week(s)  Provider:    Any APP  Other Instructions

## 2022-08-27 ENCOUNTER — Encounter (HOSPITAL_COMMUNITY): Payer: Self-pay

## 2022-08-27 ENCOUNTER — Telehealth (HOSPITAL_COMMUNITY): Payer: Self-pay | Admitting: Emergency Medicine

## 2022-08-27 NOTE — Telephone Encounter (Signed)
Attempted to call patient regarding upcoming cardiac CT appointment. °Left message on voicemail with name and callback number °Clementine Soulliere RN Navigator Cardiac Imaging °[facility] °[phone] Office °[phone] Cell ° °

## 2022-08-28 ENCOUNTER — Encounter (HOSPITAL_COMMUNITY): Admission: RE | Admit: 2022-08-28 | Payer: 59 | Source: Ambulatory Visit

## 2022-09-10 ENCOUNTER — Ambulatory Visit: Payer: 59 | Admitting: Nurse Practitioner

## 2022-09-10 DIAGNOSIS — I10 Essential (primary) hypertension: Secondary | ICD-10-CM

## 2022-09-10 DIAGNOSIS — R0789 Other chest pain: Secondary | ICD-10-CM

## 2022-09-10 DIAGNOSIS — E785 Hyperlipidemia, unspecified: Secondary | ICD-10-CM

## 2022-09-10 DIAGNOSIS — I25118 Atherosclerotic heart disease of native coronary artery with other forms of angina pectoris: Secondary | ICD-10-CM

## 2022-09-10 DIAGNOSIS — R0602 Shortness of breath: Secondary | ICD-10-CM

## 2022-09-10 DIAGNOSIS — G4733 Obstructive sleep apnea (adult) (pediatric): Secondary | ICD-10-CM

## 2022-09-10 DIAGNOSIS — N184 Chronic kidney disease, stage 4 (severe): Secondary | ICD-10-CM

## 2022-09-10 DIAGNOSIS — E119 Type 2 diabetes mellitus without complications: Secondary | ICD-10-CM

## 2022-09-10 NOTE — Progress Notes (Signed)
 This encounter was created in error - please disregard.

## 2022-09-13 ENCOUNTER — Telehealth (HOSPITAL_COMMUNITY): Payer: Self-pay | Admitting: *Deleted

## 2022-09-13 NOTE — Telephone Encounter (Signed)
Attempted to call patient regarding upcoming cardiac PET appointment. Left message on voicemail with name and callback number  Merle Prescott RN Navigator Cardiac Imaging [facility] [phone] Office [phone] Cell  Reminder to avoid caffeine 12 hours prior to his cardiac PET scan. 

## 2022-09-16 ENCOUNTER — Telehealth (HOSPITAL_COMMUNITY): Payer: Self-pay | Admitting: *Deleted

## 2022-09-16 NOTE — Telephone Encounter (Signed)
Reaching out to patient to offer assistance regarding upcoming cardiac imaging study; pt verbalizes understanding of appt date/time, parking situation and where to check in, pre-test NPO status and verified current allergies; name and call back number provided for further questions should they arise  Merle Prescott RN Navigator Cardiac Imaging [facility] [phone] office [phone] cell  Patient aware to avoid caffeine 12 hours prior to his cardiac PET scan. 

## 2022-09-17 ENCOUNTER — Encounter (HOSPITAL_COMMUNITY)
Admission: RE | Admit: 2022-09-17 | Discharge: 2022-09-17 | Disposition: A | Discharge status: Home / Self Care | Payer: 59 | Source: Ambulatory Visit | Attending: Cardiology | Admitting: Cardiology

## 2022-09-17 DIAGNOSIS — R0789 Other chest pain: Secondary | ICD-10-CM | POA: Diagnosis not present

## 2022-09-17 MED ORDER — RUBIDIUM RB82 GENERATOR (RUBYFILL)
29.5800 | PACK | Freq: Once | INTRAVENOUS | Status: AC
  Administered 2022-09-17: 29.58 via INTRAVENOUS

## 2022-09-17 MED ORDER — REGADENOSON 0.4 MG/5ML IV SOLN
0.4000 mg | Freq: Once | INTRAVENOUS | Status: AC
  Administered 2022-09-17: 0.4 mg via INTRAVENOUS

## 2022-09-17 MED ORDER — RUBIDIUM RB82 GENERATOR (RUBYFILL)
30.0000 | PACK | Freq: Once | INTRAVENOUS | Status: AC
  Administered 2022-09-17: 29.21 via INTRAVENOUS

## 2022-09-17 MED ORDER — REGADENOSON 0.4 MG/5ML IV SOLN
INTRAVENOUS | Status: AC
  Filled 2022-09-17: qty 5

## 2022-09-17 NOTE — Progress Notes (Signed)
Patient presents for a cardiac PET stress test and tolerated procedure without incident. Patient maintained acceptable vital signs throughout the test and was offered caffeine after test.  Patient ambulated out of department with a steady gait.  

## 2022-09-18 LAB — NM PET CT CARDIAC PERFUSION MULTI W/ABSOLUTE BLOODFLOW
LV dias vol: 153 mL (ref 62–150)
LV sys vol: 54 mL
MBFR: 1.85
Nuc Rest EF: 56 %
Nuc Stress EF: 65 %
Peak HR: 90 {beats}/min
Rest HR: 69 {beats}/min
Rest MBF: 0.87 ml/g/min
Rest Nuclear Isotope Dose: 29.2 mCi
ST Depression (mm): 0 mm
Stress MBF: 1.61 ml/g/min
Stress Nuclear Isotope Dose: 29.6 mCi
TID: 0.96

## 2022-09-19 ENCOUNTER — Ambulatory Visit: Payer: 59 | Attending: Nurse Practitioner | Admitting: Internal Medicine

## 2022-09-19 ENCOUNTER — Encounter: Payer: Self-pay | Admitting: Internal Medicine

## 2022-09-19 VITALS — BP 148/70 | HR 68 | Ht 66.0 in | Wt 300.4 lb

## 2022-09-19 DIAGNOSIS — R0602 Shortness of breath: Secondary | ICD-10-CM | POA: Diagnosis not present

## 2022-09-19 DIAGNOSIS — R0789 Other chest pain: Secondary | ICD-10-CM

## 2022-09-19 DIAGNOSIS — I2510 Atherosclerotic heart disease of native coronary artery without angina pectoris: Secondary | ICD-10-CM

## 2022-09-19 MED ORDER — ISOSORBIDE MONONITRATE ER 30 MG PO TB24: 30.0000 mg | ORAL_TABLET | Freq: Every day | ORAL | 3 refills | Status: DC

## 2022-09-19 NOTE — Progress Notes (Signed)
OFFICE NOTE  Chief Complaint:  Follow-up PET testing  Primary Care Physician: Legrand Pitts, [place]  HPI:  [name] [name] is a 72 y.o. male with a past medial history significant for nonobstructive coronary artery disease by cath in 2005 and 2006 with a 50% LAD stenosis.  He underwent nuclear stress testing apparently last year for chest pain symptoms which was negative for ischemia.  Recently has been having some right sided chest pain which brought him to the hospital.  He also had some upper mid epigastric pain.  He has been complaining of shortness of breath which has been persistent over the past 5 to 6 years.  He also has morbid obesity, obstructive sleep apnea, stage IV chronic kidney disease, hypertension, dyslipidemia and type 2 diabetes.  His PET scan was 2 days ago and indicated normal LVEF of 56% with augmented EF to 65%, no evidence of 3 times daily but mildly reduced myocardial blood flow with a blood flow reserve of 1.85.  This could represent small vessel ischemia.  He does also have multivessel coronary artery calcifications of the LAD circumflex and RCA.  In discussion with him today.  He reports since the study a couple days ago some left-sided chest discomfort however fleeting and not necessarily worse with exertion or relieved by rest.  Shortness of breath seems pretty stable.  He says he has seen pulmonary a couple years ago and workup was negative.  This was a suggestion that he may need to be seen again by them.  PMHx:  Past Medical History:  Diagnosis Date   Coronary artery disease    Nonobstructive per cath in 2005 and 2006 with 50% LAD stenosis, rechecked with IVUS  during cath in 2006.   Diabetes mellitus    Hyperlipidemia    Hypertension    Obesity, morbid (HCC)    OSA (obstructive sleep apnea)    Uses CPAP   Stage 4 chronic kidney disease (HCC)    Per patient    Past Surgical History:  Procedure Laterality Date   CHOLECYSTECTOMY     NASAL SINUS SURGERY       FAMHx:  Family History  Problem Relation Age of Onset   Heart failure Mother    Hypertension Mother    Diabetes Mother    Arthritis Mother    Stroke Father    Hypertension Father    Diabetes Sister    Hypertension Sister    Obesity Sister    Obesity Brother    Diabetes Brother    Hypertension Brother    Healthy Daughter     SOCHx:   reports that he quit smoking about 49 years ago. His smoking use included cigarettes. He has never used smokeless tobacco. He reports current alcohol use. He reports that he does not use drugs.  ALLERGIES:  Allergies  Allergen Reactions   Olmesartan Other (See Comments)    States it caused increase in potassium      ROS: Pertinent items noted in HPI and remainder of comprehensive ROS otherwise negative.  HOME MEDS: Current Outpatient Medications on File Prior to Visit  Medication Sig Dispense Refill   alum & mag hydroxide-simeth (MAALOX/MYLANTA) 200-200-20 MG/5ML suspension Take 15 mLs by mouth every 4 (four) hours as needed for indigestion or heartburn. 355 mL 0   amLODipine (NORVASC) 10 MG tablet Take 10 mg by mouth daily.     atorvastatin (LIPITOR) 10 MG tablet Take 1 tablet (10 mg total) by mouth daily. 30 tablet 0  calcitRIOL (ROCALTROL) 0.25 MCG capsule Take 0.25 mcg by mouth every Monday, Wednesday, and Friday. Three times weekly     doxazosin (CARDURA) 2 MG tablet Take 2 mg by mouth 3 (three) times daily.     Dulaglutide (TRULICITY) 3 MG/0.5ML SOPN Inject 3 mg into the skin every Wednesday.     ergocalciferol (VITAMIN D2) 1.25 MG (50000 UT) capsule Take 50,000 Units by mouth once a week.     febuxostat (ULORIC) 40 MG tablet Take 40 mg by mouth daily.     ferrous sulfate 325 (65 FE) MG tablet Take 325 mg by mouth daily in the afternoon.     furosemide (LASIX) 40 MG tablet Take 40 mg by mouth daily.     gemfibrozil (LOPID) 600 MG tablet Take 600 mg by mouth 2 (two) times daily.     glipiZIDE (GLUCOTROL XL) 2.5 MG 24 hr tablet  Take 2.5 mg by mouth daily with breakfast.     metoprolol succinate (TOPROL-XL) 25 MG 24 hr tablet Take 25 mg by mouth daily.     nitroGLYCERIN (NITROSTAT) 0.4 MG SL tablet Place 1 tablet (0.4 mg total) under the tongue every 5 (five) minutes as needed for chest pain. 30 tablet 0   pantoprazole (PROTONIX) 40 MG tablet Take 1 tablet (40 mg total) by mouth daily. 30 tablet 0   No current facility-administered medications on file prior to visit.    LABS/IMAGING: No results found for this or any previous visit (from the past 48 hour(s)). NM PET CT CARDIAC PERFUSION MULTI W/ABSOLUTE BLOODFLOW  Result Date: 09/18/2022   Findings are consistent with no ischemia and no infarction, there is normal perfusion imaging. The study is intermediate risk due to mildly reduced myocardial blood flow reserve, which may represent coronary microvascular disease. Cannot exclude multivessel CAD, however there is augmentation of EF and no TID.   LV perfusion is normal. There is no evidence of ischemia. There is no evidence of infarction.   Rest left ventricular function is normal. Rest EF: 56%. Stress left ventricular function is normal. Stress EF: 65%. End diastolic cavity size is moderately enlarged. End systolic cavity size is normal. No evidence of transient ischemic dilation (TID) noted.   Myocardial blood flow was computed to be 0.29ml/g/min at rest and 1.40ml/g/min at stress. Global myocardial blood flow reserve was 1.85 and was mildly abnormal.   Coronary calcium was present on the attenuation correction CT images. Severe coronary calcifications were present. Coronary calcifications were present in the left anterior descending artery, left circumflex artery and right coronary artery distribution(s). CLINICAL DATA:  This over-read does not include interpretation of cardiac or coronary anatomy or pathology. The Cardiac PET CT interpretation by the cardiologist is attached. COMPARISON:  None Available. FINDINGS: Vascular:  Cardiomegaly. No pericardial effusion. Normal caliber thoracic aorta with mild calcified plaque. Severe coronary artery calcifications. Mediastinum/Nodes: Esophagus is unremarkable. No enlarged lymph nodes seen in the chest. Lungs/Pleura: Central airways are patent. No consolidation, pleural effusion or pneumothorax. Small solid pulmonary nodule of the right middle lobe measuring 4 mm on series 4, image 38. Upper Abdomen: Hepatic steatosis. Musculoskeletal: No aggressive appearing osseous lesions. IMPRESSION: 1. No acute extracardiac abnormality. 2. Small solid pulmonary nodule of the right middle lobe measuring 4 mm. No follow-up needed if patient is low-risk.This recommendation follows the consensus statement: Guidelines for Management of Incidental Pulmonary Nodules Detected on CT Images: From the Fleischner Society 2017; Radiology 2017; 284:228-243. 3. Coronary artery calcifications and aortic Atherosclerosis (ICD10-I70.0). Electronically Signed  By: Allegra Lai M.D.   On: 09/17/2022 10:57   LIPID PANEL:    Component Value Date/Time   CHOL 125 07/10/2022 0922   TRIG 84 07/10/2022 0922   HDL 34 (L) 07/10/2022 0922   CHOLHDL 3.7 07/10/2022 0922   VLDL 17 07/10/2022 0922   LDLCALC 74 07/10/2022 0922     WEIGHTS: Wt Readings from Last 3 Encounters:  09/19/22 (!) 300 lb 6.4 oz (136.3 kg)  08/01/22 291 lb (132 kg)  07/09/22 292 lb (132.5 kg)    VITALS: BP (!) 148/70 (BP Location: Left Arm, Patient Position: Sitting, Cuff Size: Large)   Pulse 68   Ht 5\' 6"  (1.676 m)   Wt (!) 300 lb 6.4 oz (136.3 kg)   SpO2 96%   BMI 48.49 kg/m   EXAM: Deferred  EKG: Deferred  ASSESSMENT: Multivessel coronary artery calcification Dyspnea on exertion, atypical chest pain Morbid obesity   PLAN: 1.   Mr. Tober has multivessel coronary artery calcification and a negative PET for large vessel ischemia.  He does have mildly abnormal myocardial blood flow reserve suggesting possible small vessel  coronary disease.  Symptoms seem atypical for angina.  I do not expect this would give him dyspnea.  Blood pressure was a little elevated today.  If he does have some small vessel disease I would favor medical therapy for this.  Will add Imdur 30 mg daily to his regimen to see if there is any benefit.  I suspect there is a pulmonary component although may be not a reversible lung disease.  There likely is weight related dyspnea and some degree of obesity hypoventilation syndrome.  He stands and need for ongoing weight loss.  Will plan follow-up with APP or Dr. Swaziland in about 3 months to see if he had any improvement on Imdur.  Chrystie Nose, MD, [facility], FACP  [place]  [facility]  Medical Director of the [facility]  [facility] Diplomate of the American Board of Clinical Lipidology Attending Cardiologist  Direct Dial: [phone]  Fax: [phone]  Website:  [email or web address] 09/19/2022, 8:33 AM

## 2022-09-19 NOTE — Patient Instructions (Signed)
Medication Instructions:  Start Imdur 30 mg ( Take 1 Tablet Daily). *If you need a refill on your cardiac medications before your next appointment, please call your pharmacy*   Lab Work: No labs If you have labs (blood work) drawn today and your tests are completely normal, you will receive your results only by: MyChart Message (if you have MyChart) OR A paper copy in the mail If you have any lab test that is abnormal or we need to change your treatment, we will call you to review the results.   Testing/Procedures: No Testing   Follow-Up: At [facility], you and your health needs are our priority.  As part of our continuing mission to provide you with exceptional heart care, we have created designated Provider Care Teams.  These Care Teams include your primary Cardiologist (physician) and Advanced Practice Providers (APPs -  Physician Assistants and Nurse Practitioners) who all work together to provide you with the care you need, when you need it.  We recommend signing up for the patient portal called "MyChart".  Sign up information is provided on this After Visit Summary.  MyChart is used to connect with patients for Virtual Visits (Telemedicine).  Patients are able to view lab/test results, encounter notes, upcoming appointments, etc.  Non-urgent messages can be sent to your provider as well.   To learn more about what you can do with MyChart, go to [email or web address].    Your next appointment:   3 month(s)  Provider:   First Available APP or,     Peter Swaziland, MD

## 2022-12-16 ENCOUNTER — Ambulatory Visit: Payer: 59 | Admitting: Nurse Practitioner

## 2022-12-19 ENCOUNTER — Encounter: Payer: Self-pay | Admitting: Pulmonary Disease

## 2022-12-19 ENCOUNTER — Ambulatory Visit: Payer: 59 | Admitting: Pulmonary Disease

## 2022-12-19 VITALS — BP 132/62 | HR 74 | Temp 98.2°F | Ht 66.0 in | Wt 303.6 lb

## 2022-12-19 DIAGNOSIS — R0600 Dyspnea, unspecified: Secondary | ICD-10-CM

## 2022-12-19 NOTE — Progress Notes (Signed)
JUMA GATTUSO    [id number]    05-Oct-1950  Primary Care Physician:Shough, Chrissie Noa, [place]  Referring Physician: Legrand Pitts, [place] [address] [place],  [place] [address]  Chief complaint: Consult for dyspnea  HPI: 72 y.o. who  has a past medical history of Coronary artery disease, Diabetes mellitus, Hyperlipidemia, Hypertension, Obesity, morbid (HCC), OSA (obstructive sleep apnea), and Stage 4 chronic kidney disease (HCC).   Discussed the use of AI scribe software for clinical note transcription with the patient, who gave verbal consent to proceed.  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 He describes feeling 'out of breath' and 'like he could pass out' when walking or moving around. Despite these symptoms, he continues to work, driving a truck for [employer]. He uses a CPAP machine, provided by his employer, and wonders if the humidifier on the machine could be contributing to his symptoms.  In the past, he has undergone extensive cardiac and pulmonary evaluations at [facility] practice in 2020, including a lung function test showing mild restriction, normal CT scan, normal VQ scan, and a six-minute walk test during which she walked 118 m with no oxygen desaturation.  At that time it was felt that he was deconditioned and exercise recommended.  He also recently had a cardiac PET scan which showed some heart disease, but it was not deemed significant. He has a history of smoking in his twenties but quit many years ago. He has no known family history of lung disease.  His current cardiologist is Dr. Rennis Golden at [facility].  Reviewed care everywhere reports and cardiology reports in detail.   The patient also has diabetes, which is managed with Trulicity and Glipizide. He reports good blood sugar control with the use of a continuous glucose monitor. He  has been on Trulicity for approximately three to four years.   Pets: No pets Occupation: Truck driver Exposures: No known exposures.  No mold, hot tub, Jacuzzi.  No feather pillows or comforters Smoking history: Remote smoking in his 65s Travel history: No significant travel history.  Previously lived in [place]. Relevant family history: No significant family issues lung disease  Outpatient Encounter Medications as of 12/19/2022  Medication Sig   alum & mag hydroxide-simeth (MAALOX/MYLANTA) 200-200-20 MG/5ML suspension Take 15 mLs by mouth every 4 (four) hours as needed for indigestion or heartburn.   amLODipine (NORVASC) 10 MG tablet Take 10 mg by mouth daily.   atorvastatin (LIPITOR) 10 MG tablet Take 1 tablet (10 mg total) by mouth daily.   calcitRIOL (ROCALTROL) 0.25 MCG capsule Take 0.25 mcg by mouth every Monday, Wednesday, and Friday. Three times weekly   doxazosin (CARDURA) 2 MG tablet Take 2 mg by mouth 3 (three) times daily.   Dulaglutide (TRULICITY) 3 MG/0.5ML SOPN Inject 3 mg into the skin every Wednesday.   ergocalciferol (VITAMIN D2) 1.25 MG (50000 UT) capsule Take 50,000 Units by mouth once a week.   febuxostat (ULORIC) 40 MG tablet Take 40 mg by mouth daily.   ferrous sulfate 325 (65 FE) MG tablet Take 325 mg by mouth daily in the afternoon.   furosemide (LASIX) 40 MG tablet Take 40 mg by mouth daily.   gemfibrozil (LOPID) 600 MG tablet Take 600 mg by mouth 2 (two) times daily.   glipiZIDE (GLUCOTROL XL) 2.5 MG 24 hr tablet Take 2.5 mg by mouth  daily with breakfast.   isosorbide mononitrate (IMDUR) 30 MG 24 hr tablet Take 1 tablet (30 mg total) by mouth daily.   metoprolol succinate (TOPROL-XL) 25 MG 24 hr tablet Take 25 mg by mouth daily.   nitroGLYCERIN (NITROSTAT) 0.4 MG SL tablet Place 1 tablet (0.4 mg total) under the tongue every 5 (five) minutes as needed for chest pain.   pantoprazole (PROTONIX) 40 MG tablet Take 1 tablet (40 mg total) by mouth daily.   No  facility-administered encounter medications on file as of 12/19/2022.    Allergies as of 12/19/2022 - Review Complete 12/19/2022  Allergen Reaction Noted   Olmesartan Other (See Comments) 04/06/2018    Past Medical History:  Diagnosis Date   Coronary artery disease    Nonobstructive per cath in 2005 and 2006 with 50% LAD stenosis, rechecked with IVUS  during cath in 2006.   Diabetes mellitus    Hyperlipidemia    Hypertension    Obesity, morbid (HCC)    OSA (obstructive sleep apnea)    Uses CPAP   Stage 4 chronic kidney disease (HCC)    Per patient    Past Surgical History:  Procedure Laterality Date   CHOLECYSTECTOMY     NASAL SINUS SURGERY      Family History  Problem Relation Age of Onset   Heart failure Mother    Hypertension Mother    Diabetes Mother    Arthritis Mother    Stroke Father    Hypertension Father    Diabetes Sister    Hypertension Sister    Obesity Sister    Obesity Brother    Diabetes Brother    Hypertension Brother    Healthy Daughter     Social History   Socioeconomic History   Marital status: Married    Spouse name: Not on file   Number of children: Not on file   Years of education: Not on file   Highest education level: Not on file  Occupational History   Not on file  Tobacco Use   Smoking status: Former    Current packs/day: 0.00    Types: Cigarettes    Quit date: 1975    Years since quitting: 49.8   Smokeless tobacco: Never  Vaping Use   Vaping status: Never Used  Substance and Sexual Activity   Alcohol use: Yes    Comment: Very rarely   Drug use: No   Sexual activity: Yes  Other Topics Concern   Not on file  Social History Narrative   Not on file   Social Determinants of Health   Financial Resource Strain: Not on file  Food Insecurity: Not on file  Transportation Needs: Not on file  Physical Activity: Not on file  Stress: Not on file  Social Connections: Unknown (07/06/2021)   Received from [facility]   Social Network    Social Network: Not on file  Intimate Partner Violence: Unknown (05/28/2021)   Received from [facility], [facility]   HITS    Physically Hurt: Not on file    Insult or Talk Down To: Not on file    Threaten Physical Harm: Not on file    Scream or Curse: Not on file    Review of systems: Review of Systems  Constitutional: Negative for fever and chills.  HENT: Negative.   Eyes: Negative for blurred vision.  Respiratory: as per HPI  Cardiovascular: Negative for chest pain and palpitations.  Gastrointestinal: Negative for vomiting, diarrhea, blood per  rectum. Genitourinary: Negative for dysuria, urgency, frequency and hematuria.  Musculoskeletal: Negative for myalgias, back pain and joint pain.  Skin: Negative for itching and rash.  Neurological: Negative for dizziness, tremors, focal weakness, seizures and loss of consciousness.  Endo/Heme/Allergies: Negative for environmental allergies.  Psychiatric/Behavioral: Negative for depression, suicidal ideas and hallucinations.  All other systems reviewed and are negative.  Physical Exam: Blood pressure 132/62, pulse 74, temperature 98.2 F (36.8 C), temperature source Temporal, height 5\' 6"  (1.676 m), weight (!) 303 lb 9.6 oz (137.7 kg), SpO2 96%. Gen:      No acute distress HEENT:  EOMI, sclera anicteric Neck:     No masses; no thyromegaly Lungs:    Clear to auscultation bilaterally; normal respiratory effort CV:         Regular rate and rhythm; no murmurs Abd:      + bowel sounds; soft, non-tender; no palpable masses, no distension Ext:    No edema; adequate peripheral perfusion Skin:      Warm and dry; no rash Neuro: alert and oriented x 3 Psych: normal mood and affect  Data Reviewed in Care Everywhere: Imaging: VQ scan 10/26/2018-normal perfusion study CT chest 11/30/2018- No acute process. No evidence of abnormal left lung nodule.   PFTs: Spirometry 09/2018 mild restrictive lung disease with  normal DLCO. No signs of obstruction. Restriction likely secondary to morbid obesity.   6 minute walk 09/2018. Patient was able to walk 118 m without significant oxygen desaturation less than 97% on room air with exertion. 6 minute walk was stopped due to heart rate increasing from 78 at rest to 138 with exertion. Severe dyspnea reported. Patient denied chest pain. No indication for oxygen therapy.   Labs:  Assessment and Plan Chronic Shortness of Breath Longstanding dyspnea on exertion, previously evaluated with normal cardiac workup, PFTs showing mild restrictive disease, and normal CT chest. Possible multifactorial etiology including deconditioning and obesity. -Order repeat CT chest and PFTs to reassess lung function and rule out any new lung pathology. -Consider cardiopulmonary exercise test if CT and PFTs are unremarkable to further evaluate the source of dyspnea.  Obstructive Sleep Apnea Compliant with CPAP therapy, concern about possible contribution of humidifier to dyspnea.  He follows with a sleep practice close to his house -Continue CPAP therapy as it is well controlled. -CT chest will help evaluate if humidifier is causing any lung inflammation.  Diabetes Mellitus Controlled on Trulicity and Glipizide. -Discuss potential switch to Ozempic with primary care provider for additional weight loss benefits.  Obesity Contributing to dyspnea and sleep apnea. -Encourage weight loss program and discuss potential medication changes with primary care provider to aid in weight loss.  Follow-up in 3 months after completion of ordered tests.   Recommendations: High-res CT, PFTs Weight loss  Chilton Greathouse MD [facility] 12/19/2022, 9:45 AM  CC: Legrand Pitts, [place]

## 2022-12-19 NOTE — Patient Instructions (Signed)
VISIT SUMMARY:  Today, we discussed your chronic shortness of breath, sleep apnea, diabetes, and obesity. We reviewed your history and current symptoms, and we have planned several steps to reassess and manage your conditions.  YOUR PLAN:  -CHRONIC SHORTNESS OF BREATH: Chronic shortness of breath means you have ongoing difficulty breathing, especially with physical activity. We will repeat your CT chest scan and lung function tests to check for any new lung issues. If these tests are normal, we may consider a cardiopulmonary exercise test to further investigate the cause of your symptoms.  -OBSTRUCTIVE SLEEP APNEA: Obstructive sleep apnea is a condition where your breathing stops and starts during sleep. You are doing well with your CPAP therapy. We will use the CT chest scan to see if the humidifier on your CPAP machine is causing any lung inflammation.  -DIABETES MELLITUS: Diabetes mellitus is a condition where your blood sugar levels are too high. Your diabetes is well controlled with Trulicity and Glipizide. We discussed possibly switching to Ozempic with your primary care provider to help with weight loss.  -OBESITY: Obesity means having an excessive amount of body fat, which can contribute to your breathing issues and sleep apnea. We encourage you to start a weight loss program and discuss potential medication changes with your primary care provider to help with weight loss.  INSTRUCTIONS:  Please follow up in 3 months after completing the ordered tests. Make sure to discuss the potential switch to Ozempic and weight loss strategies with your primary care provider.

## 2022-12-25 ENCOUNTER — Ambulatory Visit (HOSPITAL_COMMUNITY)
Admission: RE | Admit: 2022-12-25 | Discharge: 2022-12-25 | Disposition: A | Discharge status: Home / Self Care | Payer: 59 | Source: Ambulatory Visit | Attending: Pulmonary Disease | Admitting: Pulmonary Disease

## 2022-12-25 DIAGNOSIS — R0600 Dyspnea, unspecified: Secondary | ICD-10-CM | POA: Insufficient documentation

## 2023-03-21 ENCOUNTER — Ambulatory Visit: Payer: 59 | Admitting: Pulmonary Disease

## 2023-03-21 DIAGNOSIS — R0600 Dyspnea, unspecified: Secondary | ICD-10-CM

## 2023-03-21 LAB — PULMONARY FUNCTION TEST
DL/VA % pred: 117 %
DL/VA: 4.84 ml/min/mmHg/L
DLCO cor % pred: 95 %
DLCO cor: 21.85 ml/min/mmHg
DLCO unc % pred: 95 %
DLCO unc: 21.85 ml/min/mmHg
FEF 25-75 Post: 1.16 L/s
FEF 25-75 Pre: 1.36 L/s
FEF2575-%Change-Post: -14 %
FEF2575-%Pred-Post: 54 %
FEF2575-%Pred-Pre: 64 %
FEV1-%Change-Post: -5 %
FEV1-%Pred-Post: 62 %
FEV1-%Pred-Pre: 66 %
FEV1-Post: 1.72 L
FEV1-Pre: 1.82 L
FEV1FVC-%Change-Post: -8 %
FEV1FVC-%Pred-Pre: 83 %
FEV6-%Change-Post: 2 %
FEV6-%Pred-Post: 86 %
FEV6-%Pred-Pre: 84 %
FEV6-Post: 3.05 L
FEV6-Pre: 2.98 L
FEV6FVC-%Pred-Post: 106 %
FEV6FVC-%Pred-Pre: 106 %
FVC-%Change-Post: 2 %
FVC-%Pred-Post: 80 %
FVC-%Pred-Pre: 78 %
FVC-Post: 3.06 L
FVC-Pre: 2.98 L
Post FEV1/FVC ratio: 56 %
Post FEV6/FVC ratio: 100 %
Pre FEV1/FVC ratio: 61 %
Pre FEV6/FVC Ratio: 100 %
RV % pred: 83 %
RV: 1.85 L
TLC % pred: 79 %
TLC: 4.95 L

## 2023-03-21 NOTE — Patient Instructions (Signed)
Full PFT performed today.

## 2023-03-21 NOTE — Progress Notes (Signed)
Full PFT performed today.

## 2023-04-11 ENCOUNTER — Telehealth: Payer: Self-pay | Admitting: Pulmonary Disease

## 2023-04-11 NOTE — Telephone Encounter (Signed)
Called to review PFTs which show mild obstructive airway disease.  There is no response to phone call Will discuss when he returns to clinic next month

## 2023-05-20 IMAGING — MR MR LUMBAR SPINE W/O CM
4 of 5 series · 19 of 48 positions shown · non-contrast
Comparison: MRI 07/13/2018.

CLINICAL DATA: Low back pain radiating into bilateral buttocks and
right leg x 3 years, worse over the last year.

EXAM:
MRI LUMBAR SPINE WITHOUT CONTRAST
TECHNIQUE: Multiplanar, multisequence MR imaging of the lumbar spine was
performed. No intravenous contrast was administered.

[Series 5: T1 · sagittal · 4.0mm · 0.73mm/px · 3 of 15 slices shown (1 of 2)]
[im 3/15]
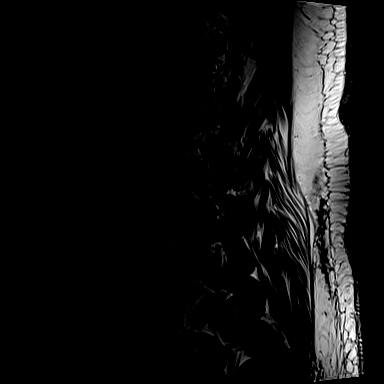
[im 9/15]
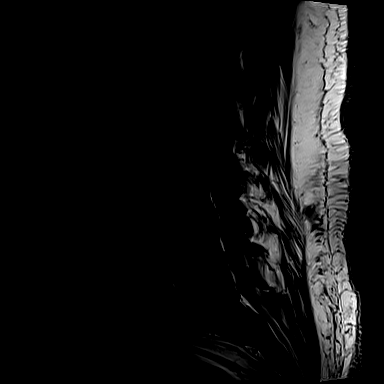
[im 15/15]
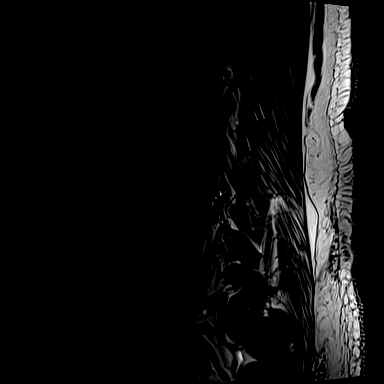

[Series 7: T2 · sagittal · 4.0mm · 0.73mm/px · 6 of 15 slices shown (1 of 2)]
[im 1/15]
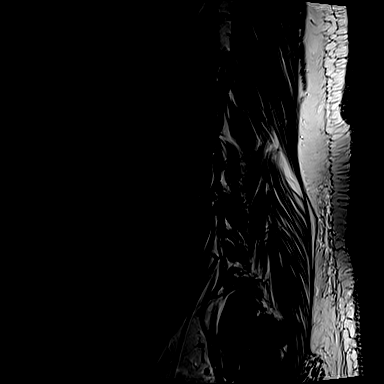
[im 3/15]
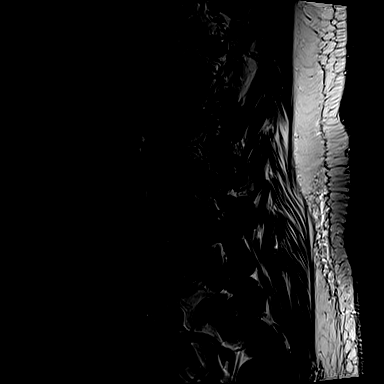
[im 6/15]
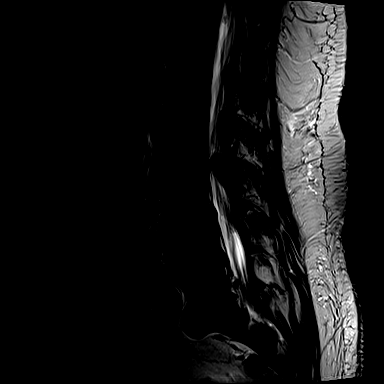
[im 9/15]
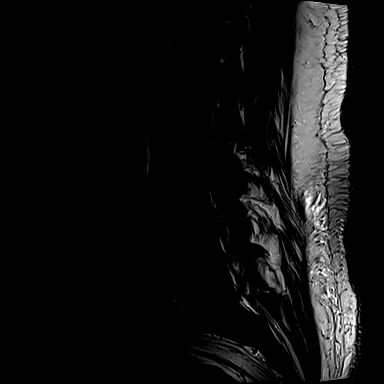
[im 12/15]
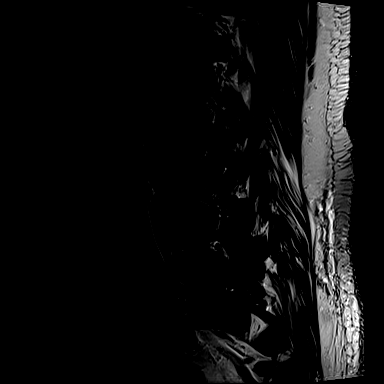
[im 15/15]
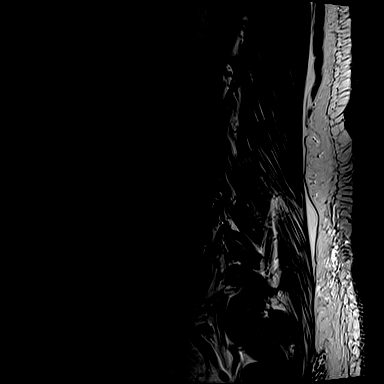

[Series 10: T1 · axial · 4.0mm · 0.28mm/px · z∈[-86,+83]mm · 3 of 40 slices shown (2 of 2)]
[im 6/40]
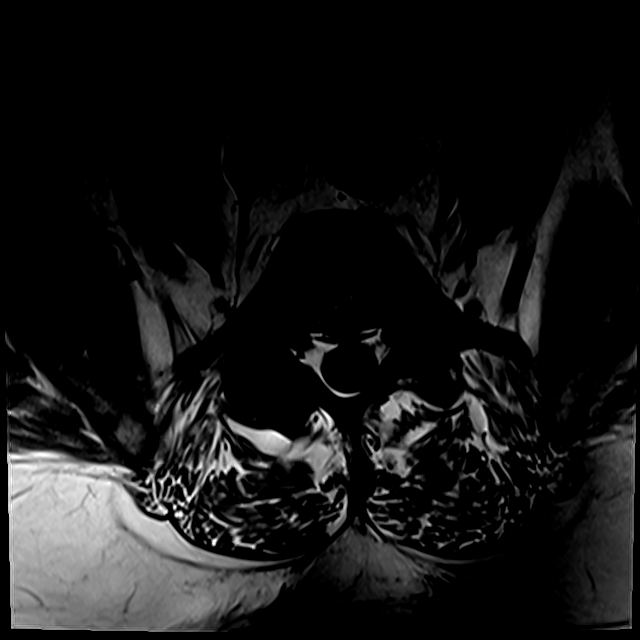
[im 20/40]
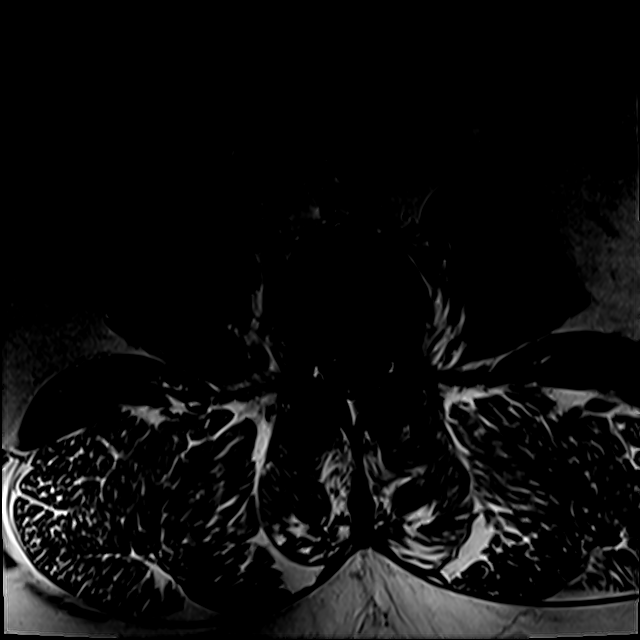
[im 34/40]
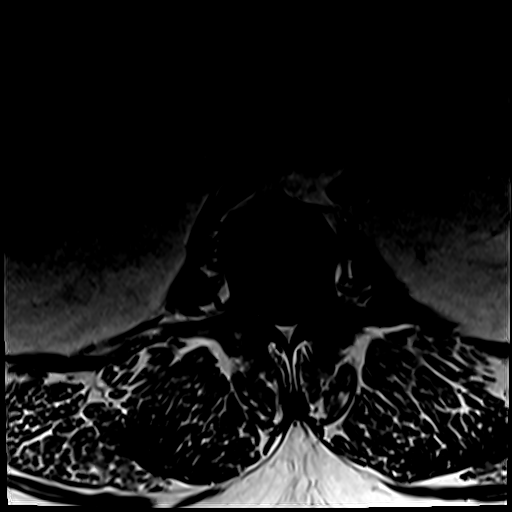

[Series 13: T2 · axial · 4.0mm · 0.28mm/px · z∈[-110,+83]mm · 7 of 40 slices shown (2 of 2)]
[im 1/40]
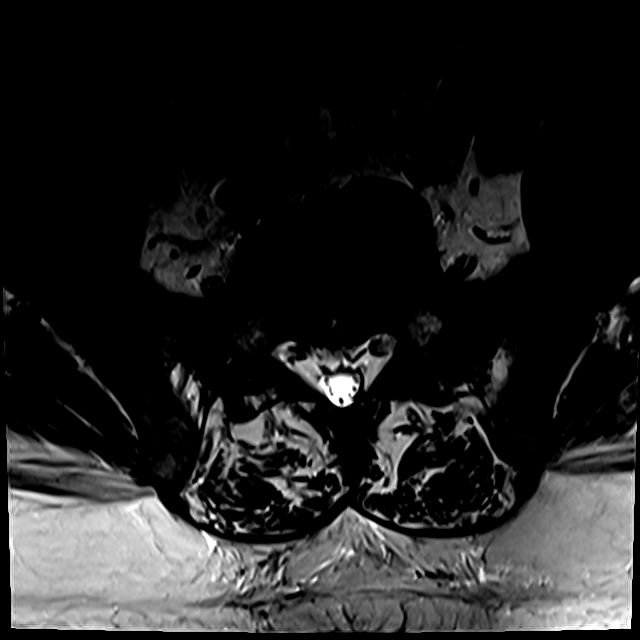
[im 6/40]
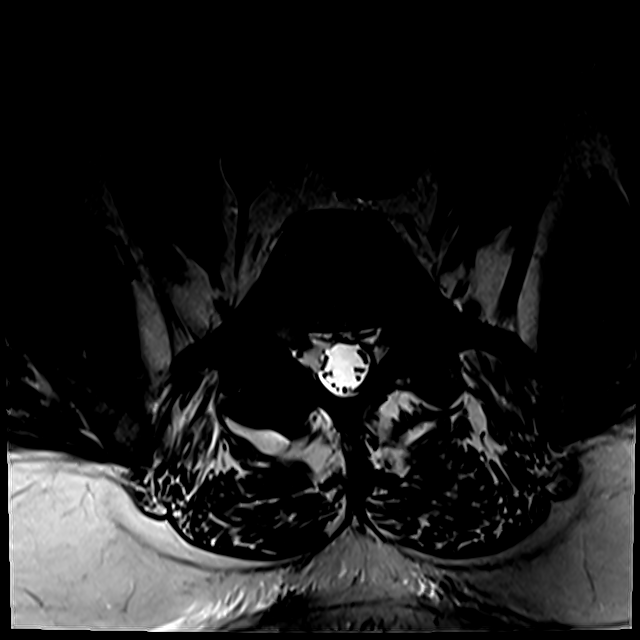
[im 12/40]
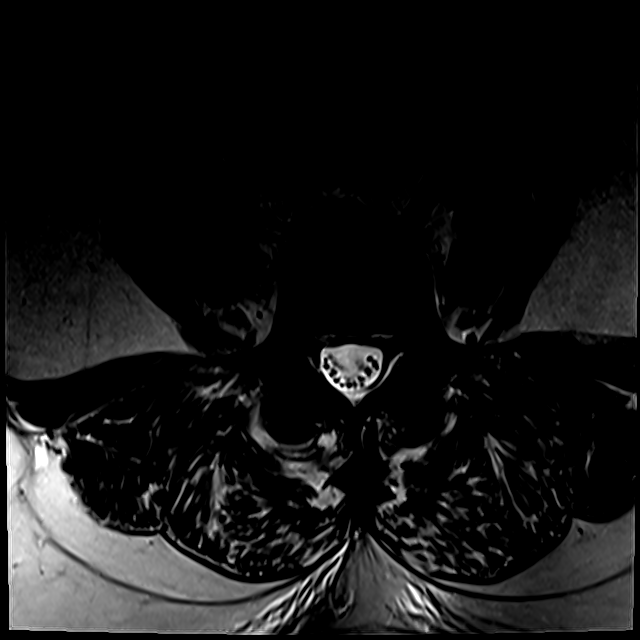
[im 17/40]
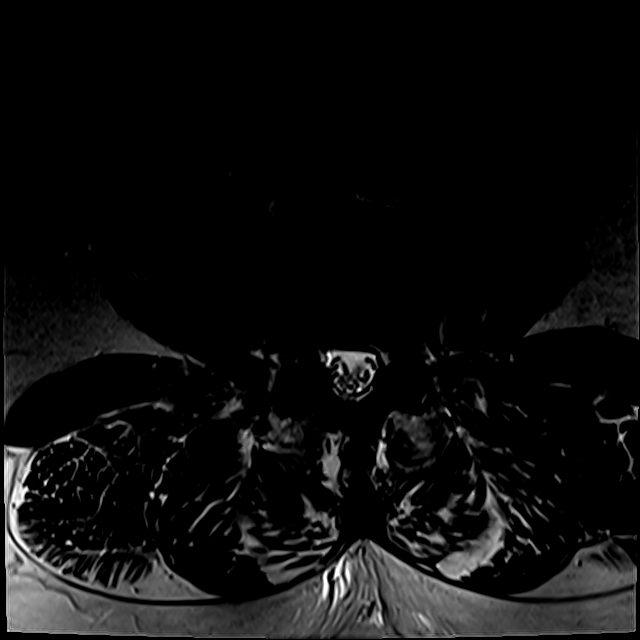
[im 20/40]
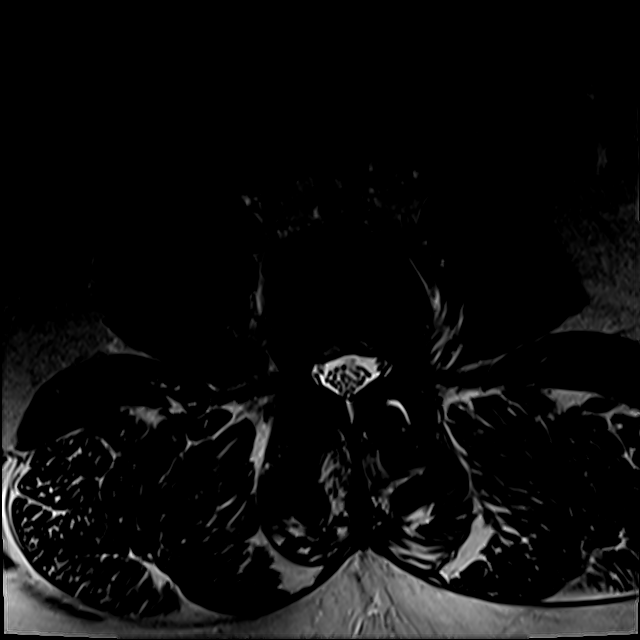
[im 23/40]
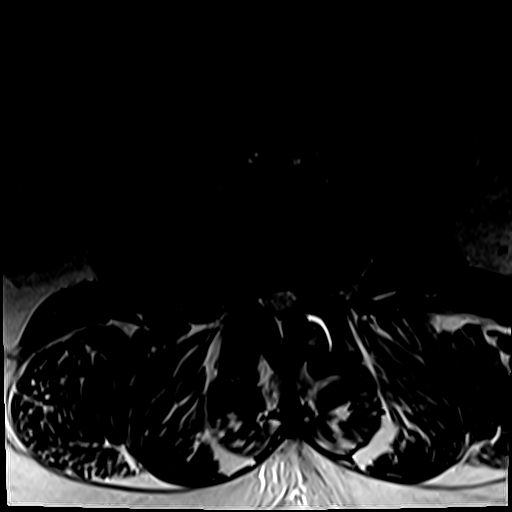
[im 34/40]
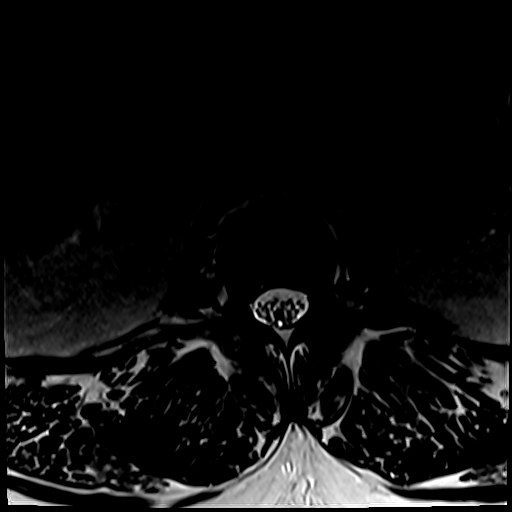

[19 of 48 positions shown; findings below may reference images not displayed]

FINDINGS: Segmentation:  5 lumbar vertebra

Alignment:  Normal

Vertebrae: No fracture or mass lesion. Degenerative endplate changes
at L3-4, L4-5, L5-S1. Bone marrow is hypointense on T1 diffusely,
similar to the prior study.

Conus medullaris and cauda equina: Conus extends to the L1-2 level.
Conus and cauda equina appear normal.

Paraspinal and other soft tissues: Negative for paraspinous mass,
adenopathy, or fluid collection

Disc levels:

L1-2: Negative

L2-3: Disc degeneration with diffuse disc bulging. Broad-based disc
protrusion has progressed in the interval. Moderate to advanced
facet degeneration bilaterally. Moderate spinal stenosis with mild
progression. Moderate subarticular stenosis bilaterally.

L3-4: Disc degeneration with diffuse bulging of the disc and
bilateral facet degeneration. Mild spinal stenosis. Mild to moderate
subarticular stenosis bilaterally. No interval change

L4-5: Disc degeneration with disc space narrowing and mild endplate
degenerative changes. Bilateral facet hypertrophy right greater than
left. Mild subarticular stenosis bilaterally right greater than
left. Spinal canal adequate in size

L5-S1: Moderate disc degeneration with disc space narrowing and
diffuse endplate spurring. Moderate facet degeneration bilaterally.
Moderate subarticular and foraminal stenosis bilaterally. No
interval change.
IMPRESSION: Multilevel lumbar spondylosis.

Progressive disc protrusion at L2-3 with progressive moderate spinal
stenosis. Other levels are stable from 8787 MRI.

## 2023-05-26 ENCOUNTER — Ambulatory Visit: Payer: 59 | Admitting: Pulmonary Disease

## 2023-05-26 ENCOUNTER — Other Ambulatory Visit: Payer: Self-pay | Admitting: Pulmonary Disease

## 2023-05-26 ENCOUNTER — Encounter: Payer: Self-pay | Admitting: Pulmonary Disease

## 2023-05-26 VITALS — BP 165/82 | HR 80 | Ht 66.0 in | Wt 290.6 lb

## 2023-05-26 DIAGNOSIS — I7121 Aneurysm of the ascending aorta, without rupture: Secondary | ICD-10-CM | POA: Diagnosis not present

## 2023-05-26 DIAGNOSIS — G4733 Obstructive sleep apnea (adult) (pediatric): Secondary | ICD-10-CM | POA: Diagnosis not present

## 2023-05-26 DIAGNOSIS — R0600 Dyspnea, unspecified: Secondary | ICD-10-CM

## 2023-05-26 MED ORDER — BUDESONIDE-FORMOTEROL FUMARATE 80-4.5 MCG/ACT IN AERO
2.0000 | INHALATION_SPRAY | Freq: Two times a day (BID) | RESPIRATORY_TRACT | 12 refills | Status: DC
Start: 2023-05-26 — End: 2023-05-28

## 2023-05-26 NOTE — Telephone Encounter (Signed)
 Pts current inhaler wixela is not covered by insurance. Pharmacy is requesting alternative option. Options are  fluticasone-salmeterol (WIXELA INHUB) 100-50 MCG/ACT AEPB  fluticasone-salmeterol (ADVAIR HFA) 45-21 MCG/ACT inhaler  Please advise Dr. Isaiah Serge

## 2023-05-26 NOTE — Progress Notes (Signed)
 [name] [name]    [id number]    1950-04-20  Primary Care Physician:Shough, Chrissie Noa, [place]  Referring Physician: Legrand Pitts, [place] [address] [place],  [place] [address]  Chief complaint: Consult for dyspnea  HPI: 73 y.o. who  has a past medical history of Coronary artery disease, Diabetes mellitus, Hyperlipidemia, Hypertension, Obesity, morbid (HCC), OSA (obstructive sleep apnea), and Stage 4 chronic kidney disease (HCC).   Discussed the use of AI scribe software for clinical note transcription with the patient, who gave verbal consent to proceed.  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 He describes feeling 'out of breath' and 'like he could pass out' when walking or moving around. Despite these symptoms, he continues to work, driving a truck for [employer]. He uses a CPAP machine, provided by his employer, and wonders if the humidifier on the machine could be contributing to his symptoms.  In the past, he has undergone extensive cardiac and pulmonary evaluations at [facility] practice in 2020, including a lung function test showing mild restriction, normal CT scan, normal VQ scan, and a six-minute walk test during which she walked 118 m with no oxygen desaturation.  At that time it was felt that he was deconditioned and exercise recommended.  He also recently had a cardiac PET scan which showed some heart disease, but it was not deemed significant. He has a history of smoking in his twenties but quit many years ago. He has no known family history of lung disease.  His current cardiologist is Dr. Rennis Golden at [facility].  Reviewed care everywhere reports and cardiology reports in detail.   The patient also has diabetes, which is managed with Trulicity and Glipizide. He reports good blood sugar control with the use of a continuous glucose monitor. He  has been on Trulicity for approximately three to four years.   Pets: No pets Occupation: Truck driver Exposures: No known exposures.  No mold, hot tub, Jacuzzi.  No feather pillows or comforters Smoking history: Remote smoking in his 22s Travel history: No significant travel history.  Previously lived in [place]. Relevant family history: No significant family issues lung disease  Interim history Discussed the use of AI scribe software for clinical note transcription with the patient, who gave verbal consent to proceed.  History of Present Illness [name] [name] is a 73 year old male who presents with shortness of breath.  He experiences shortness of breath, which has improved since losing weight with the use of Benjaro. This symptom has been persistent, prompting further investigation. A comprehensive cardiac and pulmonary workup, including a CT scan five months ago, revealed no significant cardiac issues and benign lung nodules. He has a history of smoking, which he quit in his twenties. A lung function test in January showed mild obstruction, possibly related to his past smoking history, causing his airways to not fully relax and leading to some blockage when exhaling.  He has a small dilatation of the aorta, measuring four centimeters, which is being monitored. This condition is not currently concerning but requires regular follow-up.  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 He is managed by a sleep specialist at a facility called [facility].    Outpatient Encounter Medications as of 05/26/2023  Medication Sig  alum & mag hydroxide-simeth (MAALOX/MYLANTA) 200-200-20 MG/5ML suspension Take 15 mLs by mouth every 4 (four) hours as needed for indigestion or heartburn.   amLODipine (NORVASC) 10 MG tablet Take 10 mg by mouth daily.   atorvastatin (LIPITOR) 10 MG tablet Take 1 tablet (10 mg total) by mouth  daily.   calcitRIOL (ROCALTROL) 0.25 MCG capsule Take 0.25 mcg by mouth every Monday, Wednesday, and Friday. Three times weekly   Continuous Glucose Sensor (FREESTYLE LIBRE 3 SENSOR) MISC    doxazosin (CARDURA) 2 MG tablet Take 2 mg by mouth 3 (three) times daily.   Dulaglutide (TRULICITY) 3 MG/0.5ML SOPN Inject 3 mg into the skin every Wednesday.   ergocalciferol (VITAMIN D2) 1.25 MG (50000 UT) capsule Take 50,000 Units by mouth once a week.   febuxostat (ULORIC) 40 MG tablet Take 40 mg by mouth daily.   ferrous sulfate 325 (65 FE) MG tablet Take 325 mg by mouth daily in the afternoon.   furosemide (LASIX) 40 MG tablet Take 40 mg by mouth daily.   gemfibrozil (LOPID) 600 MG tablet Take 600 mg by mouth 2 (two) times daily.   glipiZIDE (GLUCOTROL XL) 2.5 MG 24 hr tablet Take 2.5 mg by mouth daily with breakfast.   isosorbide mononitrate (IMDUR) 30 MG 24 hr tablet Take 1 tablet (30 mg total) by mouth daily.   metoprolol succinate (TOPROL-XL) 25 MG 24 hr tablet Take 25 mg by mouth daily.   MOUNJARO 10 MG/0.5ML Pen Inject 10 mg into the skin once a week.   nitroGLYCERIN (NITROSTAT) 0.4 MG SL tablet Place 1 tablet (0.4 mg total) under the tongue every 5 (five) minutes as needed for chest pain.   pantoprazole (PROTONIX) 40 MG tablet Take 1 tablet (40 mg total) by mouth daily.   No facility-administered encounter medications on file as of 05/26/2023.   Physical Exam: Blood pressure (!) 165/82, pulse 80, height 5\' 6"  (1.676 m), weight 290 lb 9.6 oz (131.8 kg), SpO2 99%. Gen:      No acute distress HEENT:  EOMI, sclera anicteric Neck:     No masses; no thyromegaly Lungs:    Clear to auscultation bilaterally; normal respiratory effort CV:         Regular rate and rhythm; no murmurs Abd:      + bowel sounds; soft, non-tender; no palpable masses, no distension Ext:    No edema; adequate peripheral perfusion Skin:      Warm and dry; no rash Neuro: alert and oriented x 3 Psych: normal mood and affect    Data: Imaging: VQ scan 10/26/2018-normal perfusion study CT chest 11/30/2018- No acute process. No evidence of abnormal left lung nodule.  Hi Res CT 12/25/2022-  No evidence of interstitial lung disease, tiny pulmonary nodules measuring 4 mm, nonspecific right axillary lymph node, 4 cm ascending thoracic aorta, coronary atherosclerosis, hepatic steatosis. I have reviewed the images personally.   PFTs: Spirometry 09/2018 mild restrictive lung disease with normal DLCO. No signs of obstruction. Restriction likely secondary to morbid obesity.   03/21/2023 FVC 3.06 [80%], FEV1 1.72 [62%], F/F56, TLC 4.95 [79%], DLCO 21.85 [95%] Mild obstructive airway disease  6 minute walk 09/2018. Patient was able to walk 118 m without significant oxygen desaturation less than 97% on room air with exertion. 6 minute walk was stopped due to heart rate increasing from 78 at rest to 138 with exertion. Severe dyspnea reported. Patient denied chest pain. No indication for oxygen therapy.   Labs: Assessment & Plan Shortness of breath  Ongoing shortness of breath, improved with weight loss. Previous cardiac and pulmonary evaluations, including an October CT scan, revealed no significant abnormalities. Lung nodules likely benign due to limited smoking history. January pulmonary function tests indicated mild obstruction, possibly from past smoking or asthma. COPD is unlikely given minimal smoking history.  - Prescribe an inhaler to assess improvement in symptoms.  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 Under the care of a sleep specialist at [facility].  Aortic dilatation Aortic dilatation measuring 4 cm, not currently concerning as intervention is considered at 5 cm or greater. Plan to monitor with periodic imaging. - Order CT angiogram in one year to monitor aortic size.   Obesity Contributing to dyspnea and  sleep apnea. -Encourage weight loss program and discuss potential medication changes with primary care provider to aid in weight loss.  Recommendations: Follow-up CTA for ascending thoracic aorta aneurysm Prescribe Symbicort Follow-up in 6 months  Chilton Greathouse MD  Pulmonary and Critical Care 05/26/2023, 9:24 AM  CC: Legrand Pitts, PA

## 2023-05-26 NOTE — Patient Instructions (Signed)
 VISIT SUMMARY:  Today, we discussed your ongoing shortness of breath, your management of obstructive sleep apnea, and the monitoring of your aortic dilatation. We reviewed your previous cardiac and pulmonary evaluations, and we have a plan to help manage your symptoms and monitor your conditions.  YOUR PLAN:  -SHORTNESS OF BREATH: Your shortness of breath has improved with weight loss. Previous evaluations, including a CT scan, showed no significant heart or lung issues, and your lung nodules are likely benign. A lung function test indicated mild obstruction, possibly from past smoking. We will prescribe an inhaler to see if it helps improve your symptoms.  -OBSTRUCTIVE SLEEP APNEA: Obstructive sleep apnea is a condition where your breathing stops and starts during sleep. 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 You are also under the care of a sleep specialist at [facility].  -AORTIC DILATATION: Aortic dilatation means that the main artery from your heart is slightly enlarged. Your aorta measures 4 cm, which is not currently concerning, but we will monitor it with periodic imaging. We will order a CT angiogram in one year to check the size of your aorta.  INSTRUCTIONS:  Please use the prescribed inhaler as directed to help with your shortness of breath. Continue using your CPAP machine regularly for sleep apnea. We will schedule a CT angiogram in one year to monitor your aortic dilatation. Follow up with your sleep specialist at [facility] as needed.

## 2023-08-25 ENCOUNTER — Other Ambulatory Visit: Payer: Self-pay | Admitting: Urology

## 2023-08-25 DIAGNOSIS — R972 Elevated prostate specific antigen [PSA]: Secondary | ICD-10-CM

## 2023-08-27 ENCOUNTER — Encounter: Payer: Self-pay | Admitting: Urology

## 2023-10-02 ENCOUNTER — Ambulatory Visit
Admission: RE | Admit: 2023-10-02 | Discharge: 2023-10-02 | Disposition: A | Discharge status: Home / Self Care | Source: Ambulatory Visit | Attending: Urology | Admitting: Urology

## 2023-10-02 DIAGNOSIS — R972 Elevated prostate specific antigen [PSA]: Secondary | ICD-10-CM

## 2023-10-02 MED ORDER — GADOPICLENOL 0.5 MMOL/ML IV SOLN
10.0000 mL | Freq: Once | INTRAVENOUS | Status: AC | PRN
  Administered 2023-10-02: 10 mL via INTRAVENOUS

## 2023-12-02 NOTE — Telephone Encounter (Signed)
 Attempted to contact patient in regards to oncology referral received from [facility]. Spoke with [name] [name], spouse, who states patient works 3rd shift and is sleeping. [name] [name] is listed in patient's chart. Used 2 patient identifiers. [name] [name] states she is not sure who patient was seen by in [place]  for hematology/oncology, but will find out tonight. Requesting call back from this writer. Will return call to verify.

## 2023-12-04 NOTE — Telephone Encounter (Signed)
 Referral Type: Hem/Onc  Attempt #: 2nd attempt   Method of Contact: Phone call to [phone]. No answer. Voicemail left with instructions to call back at [phone] to schedule appointment.   Outcome: Awaiting patient response. Will attempt contact again within 5 days.

## 2023-12-08 NOTE — Telephone Encounter (Signed)
 Patient is calling to follow up on the referral sent to us . He reports that you should try calling his wife when ready to schedule as he still works and sleeps during the day. Her number is [phone], [name] [name]

## 2023-12-10 NOTE — Telephone Encounter (Signed)
 Attempted contacting [facility] in regards to STAT record request faxed 10/13 for onoclogy records. No answer. No voicemail option after many rings. Will attempt calling back.

## 2024-01-08 ENCOUNTER — Inpatient Hospital Stay: Attending: Hematology and Oncology

## 2024-01-08 ENCOUNTER — Inpatient Hospital Stay: Admitting: Hematology and Oncology

## 2024-01-08 VITALS — BP 150/82 | HR 91 | Temp 98.4°F | Resp 18 | Ht 66.0 in | Wt 282.0 lb

## 2024-01-08 DIAGNOSIS — Z87891 Personal history of nicotine dependence: Secondary | ICD-10-CM | POA: Insufficient documentation

## 2024-01-08 DIAGNOSIS — R972 Elevated prostate specific antigen [PSA]: Secondary | ICD-10-CM | POA: Insufficient documentation

## 2024-01-08 DIAGNOSIS — D472 Monoclonal gammopathy: Secondary | ICD-10-CM | POA: Diagnosis present

## 2024-01-08 LAB — LACTATE DEHYDROGENASE: LDH: 152 U/L (ref 105–235)

## 2024-01-08 LAB — RETIC PANEL
Immature Retic Fract: 9.6 % (ref 2.3–15.9)
RBC.: 4.58 MIL/uL (ref 4.22–5.81)
Retic Count, Absolute: 76.9 K/uL (ref 19.0–186.0)
Retic Ct Pct: 1.7 % (ref 0.4–3.1)
Reticulocyte Hemoglobin: 32.4 pg (ref >27.9)

## 2024-01-08 LAB — CMP (CANCER CENTER ONLY)
ALT: 10 U/L (ref 0–44)
AST: 22 U/L (ref 15–41)
Albumin: 4.9 g/dL (ref 3.5–5.0)
Alkaline Phosphatase: 71 U/L (ref 38–126)
Anion gap: 10 (ref 5–15)
BUN: 42 mg/dL — ABNORMAL HIGH (ref 8–23)
CO2: 23 mmol/L (ref 22–32)
Calcium: 9.4 mg/dL (ref 8.9–10.3)
Chloride: 105 mmol/L (ref 98–111)
Creatinine: 2.96 mg/dL — ABNORMAL HIGH (ref 0.61–1.24)
GFR, Estimated: 22 mL/min — ABNORMAL LOW (ref >60)
Glucose, Bld: 119 mg/dL — ABNORMAL HIGH (ref 70–99)
Potassium: 4.2 mmol/L (ref 3.5–5.1)
Sodium: 138 mmol/L (ref 135–145)
Total Bilirubin: 0.3 mg/dL (ref 0.0–1.2)
Total Protein: 8.9 g/dL — ABNORMAL HIGH (ref 6.5–8.1)

## 2024-01-08 LAB — IRON AND IRON BINDING CAPACITY (CC-WL,HP ONLY)
Iron: 44 ug/dL — ABNORMAL LOW (ref 45–182)
Saturation Ratios: 10 % — ABNORMAL LOW (ref 17.9–39.5)
TIBC: 424 ug/dL (ref 250–450)
UIBC: 380 ug/dL — ABNORMAL HIGH (ref 117–376)

## 2024-01-08 LAB — FERRITIN: Ferritin: 225 ng/mL (ref 24–336)

## 2024-01-08 NOTE — Progress Notes (Signed)
 [facility] [phone]   [phone]  INITIAL CONSULT NOTE  Patient Care Team: Ursula Fallow, [place] as PCP - General (Physician Assistant) Jordan, Peter M, MD as PCP - Cardiology (Cardiology) Elana Montie CROME, RN as Oncology Nurse Navigator  Hematological/Oncological History # Monoclonal Gammopathy  11/24/2023: WBC 5.88, Hgb 12.9, MCV 85.7, Plt 342. M protein 1.3, IFE showed IgG Lambda and IgA lambda. Patient previously followed in [place]   01/08/2024: establish care with Dr. Federico   # Elevated PSA  11/24/2023: PSA 9.66.    CHIEF COMPLAINTS/PURPOSE OF CONSULTATION:  Monoclonal Gammopathy    HISTORY OF PRESENTING ILLNESS:  [name] [name] 73 y.o. male with medical history significant for coronary artery disease, type 2 diabetes, hyperlipidemia, hypertension, OSA on CPAP and CKD who presents for evaluation of a monoclonal myopathy.  On review of the previous records [name] [name] had labs collected on 11/24/2023 which showed WBC 5.88, Hgb 12.9, MCV 85.7, Plt 342. M protein 1.3, IFE showed IgG Lambda and IgA lambda.  Due to concern for these findings the patient was referred to hematology for further evaluation and management.  Of note on his records he was also found to have a PSA of 9.66.  On exam today [name] [name] reports he has been followed for this condition for approximately 7 to 8 years.  He notes that he was originally seen and treated in [place] .  He reports a bone marrow biopsy procedure was attempted but was challenging due to the pressure and he was unable to complete the procedure.  He notes due to his elevated PSA he does currently follow with Dr. Carolynn in urology.  He reports that MRI looks good and he is continuing to be monitored.  He reports no frank bone pain but does have some chronic back pain.  He also gets shots in his knees due to the discomfort.  He reports that his urine does tend to be somewhat foamy and he follows  with Dr. Tobie in nephrology.  On further discussion he reports he had no issues with fevers, chills, sweats, runny nose, sore throat, cough.  He did get his flu shot this year and also his colonoscopy.  He reports that his family history is remarkable for mother with CHF and father had a stroke.  He has a paternal uncle with colon cancer and a daughter who is 49 years old and healthy.  He has a few grandchildren through his second wife at ages 71, 21, 38, and 74.  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  He notes that he is a never smoker though he did pop a few cigarettes in his 39s.  He reports that he does drink about 2 cups of alcohol on the weekends.  Overall he feels well and denies any new symptoms out of the ordinary.  Full 10 point ROS is otherwise negative.  MEDICAL HISTORY:  Past Medical History:  Diagnosis Date   Coronary artery disease    Nonobstructive per cath in 2005 and 2006 with 50% LAD stenosis, rechecked with IVUS  during cath in 2006.   Diabetes mellitus    Hyperlipidemia    Hypertension    Obesity, morbid (HCC)    OSA (obstructive sleep apnea)    Uses CPAP   Stage 4 chronic kidney disease (HCC)    Per patient    SURGICAL HISTORY: Past Surgical History:  Procedure Laterality Date   CHOLECYSTECTOMY     NASAL SINUS SURGERY  SOCIAL HISTORY: Social History   Socioeconomic History   Marital status: Married    Spouse name: Not on file   Number of children: Not on file   Years of education: Not on file   Highest education level: Not on file  Occupational History   Not on file  Tobacco Use   Smoking status: Former    Current packs/day: 0.00    Types: Cigarettes    Quit date: 63    Years since quitting: 50.9    Passive exposure: Past   Smokeless tobacco: Never  Vaping Use   Vaping status: Never Used  Substance and Sexual Activity   Alcohol use: Yes    Comment: Very rarely   Drug use: No   Sexual activity: Yes  Other Topics Concern    Not on file  Social History Narrative   Not on file   Social Drivers of Health   Financial Resource Strain: Not on file  Food Insecurity: No Food Insecurity (01/08/2024)   Hunger Vital Sign    Worried About Running Out of Food in the Last Year: Never true    Ran Out of Food in the Last Year: Never true  Transportation Needs: No Transportation Needs (01/08/2024)   PRAPARE - Administrator, Civil Service (Medical): No    Lack of Transportation (Non-Medical): No  Physical Activity: Not on file  Stress: Not on file  Social Connections: Unknown (07/06/2021)   Received from [facility]   Social Network    Social Network: Not on file  Intimate Partner Violence: Not At Risk (01/08/2024)   Humiliation, Afraid, Rape, and Kick questionnaire    Fear of Current or Ex-Partner: No    Emotionally Abused: No    Physically Abused: No    Sexually Abused: No    FAMILY HISTORY: Family History  Problem Relation Age of Onset   Heart failure Mother    Hypertension Mother    Diabetes Mother    Arthritis Mother    Stroke Father    Hypertension Father    Diabetes Sister    Hypertension Sister    Obesity Sister    Obesity Brother    Diabetes Brother    Hypertension Brother    Healthy Daughter     ALLERGIES:  is allergic to olmesartan.  MEDICATIONS:  Current Outpatient Medications  Medication Sig Dispense Refill   doxazosin (CARDURA) 2 MG tablet Take 2 mg by mouth 3 (three) times daily. (Patient taking differently: Take 8 mg by mouth daily.)     alum & mag hydroxide-simeth (MAALOX/MYLANTA) 200-200-20 MG/5ML suspension Take 15 mLs by mouth every 4 (four) hours as needed for indigestion or heartburn. 355 mL 0   amLODipine  (NORVASC ) 10 MG tablet Take 10 mg by mouth daily.     calcitRIOL (ROCALTROL) 0.25 MCG capsule Take 0.25 mcg by mouth every Monday, Wednesday, and Friday. Three times weekly     Continuous Glucose Sensor (FREESTYLE LIBRE 3 SENSOR) MISC      ergocalciferol  (VITAMIN D2) 1.25 MG (50000 UT) capsule Take 50,000 Units by mouth once a week.     febuxostat (ULORIC) 40 MG tablet Take 40 mg by mouth daily.     ferrous sulfate 325 (65 FE) MG tablet Take 325 mg by mouth daily in the afternoon.     fluticasone-salmeterol (WIXELA INHUB) 100-50 MCG/ACT AEPB Inhale 1 puff into the lungs 2 (two) times daily. (Patient not taking: Reported on 01/08/2024) 180 each 3   furosemide (LASIX)  40 MG tablet Take 40 mg by mouth daily.     gemfibrozil  (LOPID ) 600 MG tablet Take 600 mg by mouth 2 (two) times daily.     MOUNJARO 10 MG/0.5ML Pen Inject 10 mg into the skin once a week.     nitroGLYCERIN  (NITROSTAT ) 0.4 MG SL tablet Place 1 tablet (0.4 mg total) under the tongue every 5 (five) minutes as needed for chest pain. 30 tablet 0   pantoprazole  (PROTONIX ) 40 MG tablet Take 1 tablet (40 mg total) by mouth daily. 30 tablet 0   No current facility-administered medications for this visit.    REVIEW OF SYSTEMS:   Constitutional: ( - ) fevers, ( - )  chills , ( - ) night sweats Eyes: ( - ) blurriness of vision, ( - ) double vision, ( - ) watery eyes Ears, nose, mouth, throat, and face: ( - ) mucositis, ( - ) sore throat Respiratory: ( - ) cough, ( - ) dyspnea, ( - ) wheezes Cardiovascular: ( - ) palpitation, ( - ) chest discomfort, ( - ) lower extremity swelling Gastrointestinal:  ( - ) nausea, ( - ) heartburn, ( - ) change in bowel habits Skin: ( - ) abnormal skin rashes Lymphatics: ( - ) new lymphadenopathy, ( - ) easy bruising Neurological: ( - ) numbness, ( - ) tingling, ( - ) new weaknesses Behavioral/Psych: ( - ) mood change, ( - ) new changes  All other systems were reviewed with the patient and are negative.  PHYSICAL EXAMINATION:  Vitals:   01/08/24 0858 01/08/24 0905  BP: (!) 175/81 (!) 150/82  Pulse: 91   Resp: 18   Temp: 98.4 F (36.9 C)   SpO2: 99%    Filed Weights   01/08/24 0858  Weight: 282 lb (127.9 kg)    GENERAL: well appearing elderly  African-American male in NAD  SKIN: skin color, texture, turgor are normal, no rashes or significant lesions EYES: conjunctiva are pink and non-injected, sclera clear LUNGS: clear to auscultation and percussion with normal breathing effort HEART: regular rate & rhythm and no murmurs and no lower extremity edema Musculoskeletal: no cyanosis of digits and no clubbing  PSYCH: alert & oriented x 3, fluent speech NEURO: no focal motor/sensory deficits  LABORATORY DATA:  I have reviewed the data as listed    Latest Ref Rng & Units 07/09/2022    7:15 PM 01/10/2010    5:41 AM 01/05/2010   10:30 AM  CBC  WBC 4.0 - 10.5 K/uL 6.3  7.9  6.3   Hemoglobin 13.0 - 17.0 g/dL 87.9  87.9  85.7   Hematocrit 39.0 - 52.0 % 37.6  36.8  43.9   Platelets 150 - 400 K/uL 342  288  300        Latest Ref Rng & Units 01/08/2024    9:54 AM 07/10/2022    9:22 AM 07/09/2022    7:15 PM  CMP  Glucose 70 - 99 mg/dL 880  876  866   BUN 8 - 23 mg/dL 42  32  32   Creatinine 0.61 - 1.24 mg/dL 7.03  7.50  7.55   Sodium 135 - 145 mmol/L 138  138  135   Potassium 3.5 - 5.1 mmol/L 4.2  5.0  4.6   Chloride 98 - 111 mmol/L 105  104  103   CO2 22 - 32 mmol/L 23  24  22    Calcium  8.9 - 10.3 mg/dL 9.4  9.2  9.2  Total Protein 6.5 - 8.1 g/dL 8.9  7.6    Total Bilirubin 0.0 - 1.2 mg/dL 0.3  0.6    Alkaline Phos 38 - 126 U/L 71  57    AST 15 - 41 U/L 22  24    ALT 0 - 44 U/L 10  13       ASSESSMENT & PLAN [name] [name] 73 y.o. male with medical history significant for coronary artery disease, type 2 diabetes, hyperlipidemia, hypertension, OSA on CPAP and CKD who presents for evaluation of a monoclonal myopathy.  After review of the labs, review of the records, and discussion with the patient the patients findings are most consistent with a monoclonal gammopathy of unclear significance.  Monoclonal Gammopathies are a group of medical conditions defined by the presence of a monoclonal protein (an M protein) in the blood  or urine. Monoclonal gammopathies include monoclonal gammopathy of unknown significance (MGUS), Monoclonal gammopathies of renal or neurological significance,  smoldering multiple myeloma (SMM), multiple myeloma (MM), AL amyloidosis, and Waldenstrom macroglobulinemia. The goal of the initial workup is to determine which monoclonal gammopathy a patient has. The workup consists of evaluating protein in the serum (with serum protein electrophoresis (SPEP) and serum free light chains) , evaluating protein in the urine (UPEP), and evaluation of the skeleton (DG Bone Met Survey) to assure no lytic lesions. Baseline bloodwork includes CMP and CBC. If no CRAB criteria or high risk criteria are noted then the diagnosis is MGUS. MGUS must be followed with bloodwork periodically to assure it does not convert to multiple myeloma (occurs to approximately 1% of patients per year). If there are CRAB criteria or high risk features (such as elevated serum free light chain ratio (taking into account renal function), a non IgG M protein, or M protein >1.5) then a bone marrow biopsy must be pursued.    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  Patient reports that a prior biopsy had been attempted but canceled due to his intolerance to the pain/pressure. --Will attempt to track down his records from [place] . --RTC in 6 months or sooner if intervention is required.   #history of IDA -- Will check patient's iron levels today.  # Elevated PSA -- Follows with Dr. Carolynn in urology at [facility]   Orders Placed This Encounter  Procedures   CMP (Cancer Center only)    Standing Status:   Future    Number of Occurrences:    1    Expiration Date:   01/07/2025   Ferritin    Standing Status:   Future    Number of Occurrences:   1    Expiration Date:   01/07/2025   Iron and Iron Binding Capacity ([facility] only)    Standing Status:   Future    Number of Occurrences:   1    Expiration Date:   01/07/2025   Retic Panel    Standing Status:   Future    Number of Occurrences:   1    Expiration Date:   01/07/2025   Lactate dehydrogenase (LDH)    Standing Status:   Future    Number of Occurrences:   1    Expiration Date:   01/07/2025   Multiple  Myeloma Panel (SPEP&IFE w/QIG)    Standing Status:   Future    Number of Occurrences:   1    Expiration Date:   01/07/2025   Kappa/lambda light chains    Standing Status:   Future    Number of Occurrences:   1    Expiration Date:   01/07/2025   Beta 2 microglobulin    Standing Status:   Future    Number of Occurrences:   1    Expiration Date:   01/07/2025   24-Hr Ur UPEP/UIFE/Light Chains/TP    Standing Status:   Future    Expiration Date:   01/07/2025    All questions were answered. The patient knows to call the clinic with any problems, questions or concerns.  A total of more than 60 minutes were spent on this encounter with face-to-face time and non-face-to-face time, including preparing to see the patient, ordering tests and/or medications, counseling the patient and coordination of care as outlined above.   Norleen IVAR Kidney, MD Department of Hematology/Oncology [facility] at [facility] Phone: [phone] Pager: [phone] Email: [email or web address]  01/08/2024 11:43 AM

## 2024-01-08 NOTE — Progress Notes (Signed)
 Met with patient after his appointment with Dr Federico, explained my role in his care, gave my card with direct contact number and assured [name] [name] that he could call with any questions or needs.

## 2024-01-09 LAB — BETA 2 MICROGLOBULIN, SERUM: Beta-2 Microglobulin: 4.8 mg/L — ABNORMAL HIGH (ref 0.6–2.4)

## 2024-01-09 LAB — KAPPA/LAMBDA LIGHT CHAINS
Kappa free light chain: 40.3 mg/L — ABNORMAL HIGH (ref 3.3–19.4)
Kappa, lambda light chain ratio: 0.69 (ref 0.26–1.65)
Lambda free light chains: 58 mg/L — ABNORMAL HIGH (ref 5.7–26.3)

## 2024-01-12 LAB — MULTIPLE MYELOMA PANEL, SERUM
Albumin SerPl Elph-Mcnc: 4.1 g/dL (ref 2.9–4.4)
Albumin/Glob SerPl: 1 (ref 0.7–1.7)
Alpha 1: 0.2 g/dL (ref 0.0–0.4)
Alpha2 Glob SerPl Elph-Mcnc: 1 g/dL (ref 0.4–1.0)
B-Globulin SerPl Elph-Mcnc: 1.3 g/dL (ref 0.7–1.3)
Gamma Glob SerPl Elph-Mcnc: 1.8 g/dL (ref 0.4–1.8)
Globulin, Total: 4.3 g/dL — ABNORMAL HIGH (ref 2.2–3.9)
IgA: 165 mg/dL (ref 61–437)
IgG (Immunoglobin G), Serum: 2050 mg/dL — ABNORMAL HIGH (ref 603–1613)
IgM (Immunoglobulin M), Srm: 43 mg/dL (ref 15–143)
M Protein SerPl Elph-Mcnc: 1.3 g/dL — ABNORMAL HIGH
Total Protein ELP: 8.4 g/dL (ref 6.0–8.5)

## 2024-01-13 ENCOUNTER — Encounter: Payer: Self-pay | Admitting: Medical Oncology

## 2024-01-13 NOTE — Progress Notes (Signed)
 Requested records from [facility] and received via fax. Copy given to Dr Federico and also sent to be scanned into patients chart.

## 2024-02-27 ENCOUNTER — Other Ambulatory Visit: Payer: Self-pay | Admitting: Hematology and Oncology

## 2024-02-27 DIAGNOSIS — D472 Monoclonal gammopathy: Secondary | ICD-10-CM

## 2024-03-08 ENCOUNTER — Ambulatory Visit (HOSPITAL_COMMUNITY)
Admission: RE | Admit: 2024-03-08 | Discharge: 2024-03-08 | Disposition: A | Discharge status: Home / Self Care | Source: Ambulatory Visit | Attending: Hematology and Oncology | Admitting: Hematology and Oncology

## 2024-03-08 DIAGNOSIS — D472 Monoclonal gammopathy: Secondary | ICD-10-CM | POA: Diagnosis present

## 2024-07-01 ENCOUNTER — Inpatient Hospital Stay: Attending: Hematology and Oncology

## 2024-07-08 ENCOUNTER — Inpatient Hospital Stay: Admitting: Hematology and Oncology
